# Patient Record
Sex: Female | Born: 1960 | Race: Black or African American | Hispanic: No | Marital: Single | Smoking: Former smoker
Health system: Southern US, Community
[De-identification: ages and names within clinical notes are randomized; demographics above are authoritative.]

## PROBLEM LIST (undated history)

## (undated) DIAGNOSIS — T7840XA Allergy, unspecified, initial encounter: Secondary | ICD-10-CM

## (undated) DIAGNOSIS — F32A Depression, unspecified: Secondary | ICD-10-CM

## (undated) DIAGNOSIS — E785 Hyperlipidemia, unspecified: Secondary | ICD-10-CM

## (undated) DIAGNOSIS — B20 Human immunodeficiency virus [HIV] disease: Secondary | ICD-10-CM

## (undated) DIAGNOSIS — J45909 Unspecified asthma, uncomplicated: Secondary | ICD-10-CM

## (undated) DIAGNOSIS — F411 Generalized anxiety disorder: Secondary | ICD-10-CM

## (undated) DIAGNOSIS — G4700 Insomnia, unspecified: Secondary | ICD-10-CM

## (undated) DIAGNOSIS — F329 Major depressive disorder, single episode, unspecified: Secondary | ICD-10-CM

## (undated) DIAGNOSIS — M810 Age-related osteoporosis without current pathological fracture: Secondary | ICD-10-CM

## (undated) DIAGNOSIS — M797 Fibromyalgia: Secondary | ICD-10-CM

## (undated) DIAGNOSIS — S7291XA Unspecified fracture of right femur, initial encounter for closed fracture: Secondary | ICD-10-CM

## (undated) DIAGNOSIS — M4800 Spinal stenosis, site unspecified: Secondary | ICD-10-CM

## (undated) DIAGNOSIS — M1990 Unspecified osteoarthritis, unspecified site: Secondary | ICD-10-CM

## (undated) DIAGNOSIS — K219 Gastro-esophageal reflux disease without esophagitis: Secondary | ICD-10-CM

## (undated) DIAGNOSIS — G8929 Other chronic pain: Secondary | ICD-10-CM

## (undated) DIAGNOSIS — Z5189 Encounter for other specified aftercare: Secondary | ICD-10-CM

## (undated) DIAGNOSIS — D649 Anemia, unspecified: Secondary | ICD-10-CM

## (undated) DIAGNOSIS — I10 Essential (primary) hypertension: Secondary | ICD-10-CM

## (undated) HISTORY — DX: Unspecified asthma, uncomplicated: J45.909

## (undated) HISTORY — DX: Insomnia, unspecified: G47.00

## (undated) HISTORY — PX: HERNIA REPAIR: SHX51

## (undated) HISTORY — DX: Hyperlipidemia, unspecified: E78.5

## (undated) HISTORY — DX: Unspecified fracture of right femur, initial encounter for closed fracture: S72.91XA

## (undated) HISTORY — DX: Generalized anxiety disorder: F41.1

## (undated) HISTORY — DX: Human immunodeficiency virus (HIV) disease: B20

## (undated) HISTORY — DX: Depression, unspecified: F32.A

## (undated) HISTORY — DX: Essential (primary) hypertension: I10

## (undated) HISTORY — DX: Morbid (severe) obesity due to excess calories: E66.01

## (undated) HISTORY — DX: Allergy, unspecified, initial encounter: T78.40XA

## (undated) HISTORY — DX: Other chronic pain: G89.29

## (undated) HISTORY — DX: Age-related osteoporosis without current pathological fracture: M81.0

## (undated) HISTORY — DX: Encounter for other specified aftercare: Z51.89

## (undated) HISTORY — PX: BUNIONECTOMY: SHX129

## (undated) HISTORY — PX: CHOLECYSTECTOMY: SHX55

## (undated) HISTORY — DX: Anemia, unspecified: D64.9

## (undated) HISTORY — DX: Spinal stenosis, site unspecified: M48.00

## (undated) HISTORY — PX: UPPER GASTROINTESTINAL ENDOSCOPY: SHX188

## (undated) HISTORY — DX: Fibromyalgia: M79.7

## (undated) HISTORY — PX: OTHER SURGICAL HISTORY: SHX169

## (undated) HISTORY — DX: Gastro-esophageal reflux disease without esophagitis: K21.9

## (undated) HISTORY — PX: COLONOSCOPY: SHX174

## (undated) HISTORY — DX: Major depressive disorder, single episode, unspecified: F32.9

## (undated) HISTORY — DX: Unspecified osteoarthritis, unspecified site: M19.90

---

## 1996-12-09 DIAGNOSIS — B20 Human immunodeficiency virus [HIV] disease: Secondary | ICD-10-CM

## 1996-12-09 DIAGNOSIS — Z21 Asymptomatic human immunodeficiency virus [HIV] infection status: Secondary | ICD-10-CM

## 1996-12-09 HISTORY — DX: Human immunodeficiency virus (HIV) disease: B20

## 1996-12-09 HISTORY — DX: Asymptomatic human immunodeficiency virus (hiv) infection status: Z21

## 2009-08-21 ENCOUNTER — Emergency Department (HOSPITAL_COMMUNITY): Admission: EM | Admit: 2009-08-21 | Discharge: 2009-08-21 | Payer: Self-pay | Admitting: Emergency Medicine

## 2011-03-15 LAB — COMPREHENSIVE METABOLIC PANEL
ALT: 15 U/L (ref 0–35)
AST: 22 U/L (ref 0–37)
Albumin: 3.5 g/dL (ref 3.5–5.2)
CO2: 28 mEq/L (ref 19–32)
Calcium: 8.7 mg/dL (ref 8.4–10.5)
Creatinine, Ser: 0.65 mg/dL (ref 0.4–1.2)
GFR calc non Af Amer: >60 mL/min (ref >60)
Potassium: 4 mEq/L (ref 3.5–5.1)

## 2011-03-15 LAB — DIFFERENTIAL
Eosinophils Absolute: 0 10*3/uL (ref 0.0–0.7)
Eosinophils Relative: 0 % (ref 0–5)
Lymphocytes Relative: 9 % — ABNORMAL LOW (ref 12–46)
Lymphs Abs: 0.9 10*3/uL (ref 0.7–4.0)
Monocytes Absolute: 0.3 10*3/uL (ref 0.1–1.0)
Monocytes Relative: 3 % (ref 3–12)

## 2011-03-15 LAB — URINALYSIS, ROUTINE W REFLEX MICROSCOPIC
Bilirubin Urine: NEGATIVE
Glucose, UA: NEGATIVE mg/dL
Hgb urine dipstick: NEGATIVE
Ketones, ur: NEGATIVE mg/dL
pH: 6 (ref 5.0–8.0)

## 2011-03-15 LAB — CBC
HCT: 36.5 % (ref 36.0–46.0)
Hemoglobin: 12.6 g/dL (ref 12.0–15.0)
RDW: 16.3 % — ABNORMAL HIGH (ref 11.5–15.5)

## 2011-10-03 DIAGNOSIS — B20 Human immunodeficiency virus [HIV] disease: Secondary | ICD-10-CM | POA: Insufficient documentation

## 2012-07-21 DIAGNOSIS — Z6841 Body Mass Index (BMI) 40.0 and over, adult: Secondary | ICD-10-CM | POA: Insufficient documentation

## 2014-05-19 DIAGNOSIS — I10 Essential (primary) hypertension: Secondary | ICD-10-CM | POA: Insufficient documentation

## 2017-02-06 HISTORY — PX: ORIF FEMUR FRACTURE: SHX2119

## 2017-07-28 ENCOUNTER — Encounter: Payer: Self-pay | Admitting: Infectious Diseases

## 2017-07-28 ENCOUNTER — Ambulatory Visit (INDEPENDENT_AMBULATORY_CARE_PROVIDER_SITE_OTHER): Payer: Medicare (Managed Care) | Admitting: Infectious Diseases

## 2017-07-28 VITALS — BP 159/91 | HR 82 | Temp 98.6°F

## 2017-07-28 DIAGNOSIS — Z0000 Encounter for general adult medical examination without abnormal findings: Secondary | ICD-10-CM | POA: Diagnosis not present

## 2017-07-28 DIAGNOSIS — R252 Cramp and spasm: Secondary | ICD-10-CM | POA: Diagnosis not present

## 2017-07-28 DIAGNOSIS — Z1231 Encounter for screening mammogram for malignant neoplasm of breast: Secondary | ICD-10-CM | POA: Diagnosis not present

## 2017-07-28 DIAGNOSIS — Z9189 Other specified personal risk factors, not elsewhere classified: Secondary | ICD-10-CM | POA: Diagnosis not present

## 2017-07-28 DIAGNOSIS — B20 Human immunodeficiency virus [HIV] disease: Secondary | ICD-10-CM | POA: Diagnosis not present

## 2017-07-28 DIAGNOSIS — I10 Essential (primary) hypertension: Secondary | ICD-10-CM

## 2017-07-28 DIAGNOSIS — Z5181 Encounter for therapeutic drug level monitoring: Secondary | ICD-10-CM | POA: Diagnosis not present

## 2017-07-28 DIAGNOSIS — Z1239 Encounter for other screening for malignant neoplasm of breast: Secondary | ICD-10-CM

## 2017-07-28 LAB — CBC WITH DIFFERENTIAL/PLATELET
BASOS PCT: 0 %
Basophils Absolute: 0 cells/uL (ref 0–200)
EOS ABS: 144 {cells}/uL (ref 15–500)
Eosinophils Relative: 2 %
HEMATOCRIT: 36.3 % (ref 35.0–45.0)
Hemoglobin: 11.9 g/dL (ref 11.7–15.5)
Lymphocytes Relative: 21 %
Lymphs Abs: 1512 cells/uL (ref 850–3900)
MCH: 31.3 pg (ref 27.0–33.0)
MCHC: 32.8 g/dL (ref 32.0–36.0)
MCV: 95.5 fL (ref 80.0–100.0)
MONO ABS: 288 {cells}/uL (ref 200–950)
MPV: 9.7 fL (ref 7.5–12.5)
Monocytes Relative: 4 %
NEUTROS ABS: 5256 {cells}/uL (ref 1500–7800)
Neutrophils Relative %: 73 %
PLATELETS: 270 10*3/uL (ref 140–400)
RBC: 3.8 MIL/uL (ref 3.80–5.10)
RDW: 14.1 % (ref 11.0–15.0)
WBC: 7.2 10*3/uL (ref 3.8–10.8)

## 2017-07-28 NOTE — Assessment & Plan Note (Signed)
Will check mag and potassium today. I believe this is due to her HCTZ.

## 2017-07-28 NOTE — Assessment & Plan Note (Addendum)
Will continue Odefsey and Tivicay today. 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 She was virologically suppressed at the last labs from March 2018. I have looked at her GenoSure. No signs of OI today on exam. I believe her CD4 was acutely low in the setting of trauma / stress. Will reassess VL and CD4 today with reflex to geno. I have also asked her to supply a urine sample for U/A. She will return to see me in about 3 months.

## 2017-07-28 NOTE — Patient Instructions (Addendum)
We will check your blood today including kidney, liver and electrolyte lab work.   Call your provider that started you on the Hydrochlorothiazide - it is likely what is causing your cramps in your legs and dizziness. I worry you will not be able to tolerate a higher dose.   We will continue your Tivicay and Charlett Lango today.   We can do your pap smear at your next appointment. I have sent in orders for your mammogram and DEXA (Bone) scan.   Call for an appointment at the [facility] [place]:  Address: [address], [place], [place] [address]  Phone: [phone]  We will see you back in 3 months   Please sign up with MyChart to access your labs and set up email communication with our clinic for non-urgent medical concerns.

## 2017-07-28 NOTE — Progress Notes (Signed)
PCP - Dr. Lilyan Gilford [facility]   Patient Active Problem List   Diagnosis Date Noted  . Leg cramps 07/28/2017  . Healthcare maintenance 07/28/2017  . Essential hypertension 05/19/2014  . Morbid obesity with BMI of 40.0-44.9, adult ([place]) 07/21/2012  . Human immunodeficiency virus (HIV) disease ([place]) 10/03/2011    Patient's Medications  New Prescriptions   No medications on file  Previous Medications   ALBUTEROL (PROVENTIL HFA;VENTOLIN HFA) 108 (90 BASE) MCG/ACT INHALER    Inhale 2 puffs into the lungs every 4 (four) hours as needed for wheezing or shortness of breath.   ASPIRIN EC 81 MG TABLET    Take 81 mg by mouth daily.   DIPHENHYDRAMINE (BENADRYL) 25 MG TABLET    Take 25 mg by mouth every 8 (eight) hours as needed.   DOLUTEGRAVIR (TIVICAY) 50 MG TABLET    Take 50 mg by mouth daily.   DULOXETINE (CYMBALTA) 30 MG CAPSULE    Take 30 mg by mouth daily.   EMTRICITABINE-RILPIVIR-TENOFOVIR AF (ODEFSEY) 200-25-25 MG TABS TABLET    Take 1 tablet by mouth daily with breakfast.   EPINEPHRINE (EPIPEN 2-PAK) 0.3 MG/0.3 ML IJ SOAJ INJECTION    Inject into the muscle once.   HYDROCHLOROTHIAZIDE (MICROZIDE) 12.5 MG CAPSULE    Take 12.5 mg by mouth daily.   IBUPROFEN (ADVIL,MOTRIN) 200 MG TABLET    Take 200 mg by mouth every 6 (six) hours as needed.   LORATADINE (CLARITIN) 10 MG TABLET    Take 10 mg by mouth daily.   ONDANSETRON (ZOFRAN-ODT) 4 MG DISINTEGRATING TABLET    Take 4 mg by mouth every 8 (eight) hours as needed for nausea or vomiting.   VITAMIN C (ASCORBIC ACID) 500 MG TABLET    Take 500 mg by mouth daily.  Modified Medications   No medications on file  Discontinued Medications   No medications on file    Subjective: Katherine Howell presents to clinic today for routine follow up care for her HIV infection. She was referred by [facility]. Has been [place] since 1998 (CD4 nadir 10) due to multiple blood transfusions. Other medical problems include hypertension,  fibromyalgia, insomnia, osteoporosis, anemia, lung disease (inhaler use frequently), arthritis in knees, obesity, fall in 02/12/2017 requiring orthopedic repair of right knee - was discharged to [facility] in [place]. Her HIV was managed through Dr. Lilyan Gilford @ [facility].   HPI:  HIV =  Currently on a regimen of [place] and is tolerating it well. Previously on Combivir and Efavirenz. Over the last 30 days she has missed no doses and tolerating this regimen well aside from some occasional sleep problems. No concerns regarding medications or HIV disease. Denies associated symptoms including fevers, chills, headaches, weight loss, SOB, abdominal pain / cramping, diarrhea, lymphadenopathy.  Last viral load undetectable. CD4 counts usually > 500 however during acute hospitalization after fracture and surgery this dropped to 190 in March 2018. No further labs to follow up since. Not currently sexually active.    Hypertension = was started on HCTZ 12.5 mg a week ago. Now endorses leg cramps and dizziness. Denies any headaches. Has taken a few extra potassium pills lately to help remedy cramps in addition to mustard. 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   Health Maintenance = Uses mobility aids and a wheelchair at times d/t knee pain, immobility and DOE. She cares for  her mother (59 yo with Alzheimers) and up until recently was caring for her autistic son however he is now in a group home. High amount of stress. Last pap and mammogram were ~ 3y ago and reportedly normal. Colonoscopy normal around 56 yo with 56yrfollow up recommended and DEXA about 5 years ago.   Review of Systems: Review of Systems  Constitutional: Negative for chills, fever, malaise/fatigue and weight loss.  HENT: Negative for sore throat.        No dental problems  Respiratory: Negative for cough and sputum production.   Cardiovascular: Positive  for leg swelling. Negative for chest pain.  Gastrointestinal: Negative for abdominal pain, diarrhea and vomiting.  Genitourinary: Negative for dysuria.  Musculoskeletal: Positive for joint pain (knees and ankles). Negative for myalgias and neck pain.  Skin: Negative for rash.  Neurological: Negative for dizziness and headaches.  Psychiatric/Behavioral: Negative for depression and substance abuse. The patient has insomnia. The patient is not nervous/anxious.     Past Medical History:  Diagnosis Date  . Anemia   . Arthritis   . Chronic pain   . Fibromyalgia   . GAD (generalized anxiety disorder)   . HIV (human immunodeficiency virus infection) ([place] 1998  . Hypertension   . Insomnia   . Obesity, morbid, BMI 40.0-49.9 ([place]   . Spinal stenosis     Social History  Substance Use Topics  . Smoking status: Former Smoker    Types: Cigarettes  . Smokeless tobacco: Former USystems developer . Alcohol use No     Comment: occ    Family History  Problem Relation Age of Onset  . Alzheimer's disease Mother     Allergies  Allergen Reactions  . Iodine Anaphylaxis  . Other Anaphylaxis  . Shellfish-Derived Products Rash    Other reaction(s): Respiratory Distress (ALLERGY/intolerance), Shock (ALLERGY) SHRIMP-Rash] [Shock] [Resp]  . Diflunisal Other (See Comments)    Other reaction(s): Facial Edema (intolerance)  . Penicillins Other (See Comments)    Other] headache  . Sulfamethoxazole Rash  . Voltaren  [Diclofenac Sodium] Swelling    Unilateral leg swelling Unilateral leg swelling  . Diclofenac     Objective:  Vitals:   07/28/17 1343  BP: (!) 159/91  Pulse: 82  Temp: 98.6 F (37 C)  TempSrc: Oral   There is no height or weight on file to calculate BMI.  Physical Exam  Constitutional: She is oriented to person, place, and time and well-developed, well-nourished, and in no distress.  Very pleasant obese woman seated in wheelchair.   HENT:  Mouth/Throat: No oral lesions. No dental  abscesses. No oropharyngeal exudate.  Eyes: No scleral icterus.  Neck: Neck supple.  Cardiovascular: Normal rate, regular rhythm, normal heart sounds and intact distal pulses.   No murmur heard. Pulmonary/Chest: Effort normal and breath sounds normal.  Abdominal: Soft. She exhibits no distension. There is no tenderness.  Musculoskeletal: Normal range of motion. She exhibits no tenderness.  Lymphadenopathy:    She has no cervical adenopathy.  Neurological: She is alert and oriented to person, place, and time.  Skin: Skin is warm and dry. No rash noted.  Psychiatric: Mood, affect and judgment normal.    Lab Results  GenoSure  NRTIs  ZDV (zidovudine or Retrovir)  ! NO! ABC (abacavir or Ziagen)  ! NO! ddI (didanosine or Videx)  ! NO! 3TC (lamivudine or Epivir) !YES!M184V FTC (emtricitabine or Emtriva) !YES!M184V d4T (stavudine or Zerit)  ! NO! TDF (tenofovir or Viread)   ! NO!  NNRTIs ETR (etravirine or Intelence)  ! NO! EFV (efavirenz or Sustiva) !DJT!T017B NVP (nevirapine or Viramune) !LTJ!Q300P RPV (rilpivirine or Edurant)  ! NO!  PIs  FPV (fos-amprenavir or Lexiva) ! NO! IDV (indinavir or Crixivan)   ! NO! NFV (nelfinavir or Viracept)  ! NO! SQV (saquinavir or Invirase) ! NO! LPV (lopinavir or Kaletra)  ! NO! ATV (atazanavir or Reyataz) ! NO! TPV (tipranavir or Aptivus)   ! NO! DRV (darunavir or Prezista)  ! NO!  PRB = PROBABLE OR EMERGING RESISTANCE OTHER MUTATIONS DETECTED: RT GENE MUTATIONS: V90I,H221Y PR GENE MUTATIONS: I13V,G16E,L63A,V77I       I reviewed all records sent to our clinic and in [place] related to her HIV.    Problem List Items Addressed This Visit      Cardiovascular and Mediastinum   Essential hypertension    Started on HCTZ 12.5 mg 1 week ago by PCP. Reports worsening  leg cramps and dizziness. I worry she will not be able to tolerate an increased in this dose. I suggested she call her PCP to discuss side effects and consideration for alternative medication. She is above target BP today. Will screen diabetes and lipids today.       Relevant Medications   hydrochlorothiazide (MICROZIDE) 12.5 MG capsule     Other   Human immunodeficiency virus (HIV) disease (HCC) (Chronic)    Will continue Odefsey and Tivicay today. 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 She was virologically suppressed at the last labs from March 2018. I have looked at her [place]. No signs of OI today on exam. I believe her CD4 was acutely low in the setting of trauma / stress. Will reassess VL and CD4 today with reflex to geno. I have also asked her to supply a urine sample for U/A. She will return to see me in about 3 months.       Relevant Orders   Hepatitis B surface antigen   Hepatitis B surface antibody   Hepatitis A antibody, total   RPR   HLA B*5701   HIV-1 RNA ultraquant reflex to gentyp+   T-helper cell (CD4)- ([facility] clinic only)   COMPLETE METABOLIC PANEL WITH GFR   CBC with Differential/Platelet   Quantiferon tb gold assay (blood)   Magnesium   Leg cramps    Will check mag and potassium today. I believe this is due to her HCTZ.       Healthcare maintenance    Orders for screening DEXA and Mammogram placed today. Will do PAP at next appointment. Hep B sAb was neg in 2015 but she reports she either started or completed the series at [facility]. Will assess immunity today and determine need for double strength booster. Influenza vaccine at next visit as well.        Other Visit Diagnoses    Breast screening    -  Primary   Breast cancer screening       Relevant Orders   MM Digital Screening   At risk for decreased bone density       Relevant Orders   DG DXA FRACTURE ASSESSMENT   Medication monitoring encounter       Relevant Orders   Lipid panel   Hemoglobin A1c   Urinalysis        Janene Madeira, MSN, NP-C [facility]   07/28/17 2:47 PM

## 2017-07-28 NOTE — Assessment & Plan Note (Signed)
Started on HCTZ 12.5 mg 1 week ago by PCP. Reports worsening leg cramps and dizziness. I worry she will not be able to tolerate an increased in this dose. I suggested she call her PCP to discuss side effects and consideration for alternative medication. She is above target BP today. Will screen diabetes and lipids today.

## 2017-07-28 NOTE — Assessment & Plan Note (Signed)
Orders for screening DEXA and Mammogram placed today. Will do PAP at next appointment. Hep B sAb was neg in 2015 but she reports she either started or completed the series at [facility]. Will assess immunity today and determine need for double strength booster. Influenza vaccine at next visit as well.

## 2017-07-29 LAB — URINALYSIS, ROUTINE W REFLEX MICROSCOPIC
BILIRUBIN URINE: NEGATIVE
Glucose, UA: NEGATIVE mg/dL
Hgb urine dipstick: NEGATIVE
Ketones, ur: NEGATIVE mg/dL
Leukocytes, UA: NEGATIVE
NITRITE: NEGATIVE
PROTEIN: NEGATIVE mg/dL
SPECIFIC GRAVITY, URINE: 1.011 (ref 1.005–1.030)
pH: 5 (ref 5.0–8.0)

## 2017-07-29 LAB — T-HELPER CELL (CD4) - (RCID CLINIC ONLY)
CD4 T CELL HELPER: 28 % — AB (ref 33–55)
CD4 T Cell Abs: 470 /uL (ref 400–2700)

## 2017-07-29 LAB — HEPATITIS B SURFACE ANTIBODY,QUALITATIVE: HEP B S AB: NONREACTIVE

## 2017-07-29 LAB — HEPATITIS B SURFACE ANTIGEN: HEP B S AG: NONREACTIVE

## 2017-07-29 LAB — HEMOGLOBIN A1C
HEMOGLOBIN A1C: 4.8 % (ref <5.7)
Mean Plasma Glucose: 91 mg/dL

## 2017-07-29 LAB — COMPLETE METABOLIC PANEL WITH GFR
ALT: 10 U/L (ref 6–29)
AST: 23 U/L (ref 10–35)
Albumin: 4 g/dL (ref 3.6–5.1)
Alkaline Phosphatase: 88 U/L (ref 33–130)
BUN: 12 mg/dL (ref 7–25)
CHLORIDE: 100 mmol/L (ref 98–110)
CO2: 24 mmol/L (ref 20–32)
Calcium: 9.4 mg/dL (ref 8.6–10.4)
Creat: 0.67 mg/dL (ref 0.50–1.05)
Glucose, Bld: 93 mg/dL (ref 65–99)
POTASSIUM: 3.4 mmol/L — AB (ref 3.5–5.3)
Sodium: 137 mmol/L (ref 135–146)
Total Bilirubin: 0.4 mg/dL (ref 0.2–1.2)
Total Protein: 8.9 g/dL — ABNORMAL HIGH (ref 6.1–8.1)

## 2017-07-29 LAB — LIPID PANEL
CHOL/HDL RATIO: 3.3 ratio (ref <5.0)
Cholesterol: 221 mg/dL — ABNORMAL HIGH (ref <200)
HDL: 67 mg/dL (ref >50)
LDL Cholesterol: 137 mg/dL — ABNORMAL HIGH (ref <100)
TRIGLYCERIDES: 85 mg/dL (ref <150)
VLDL: 17 mg/dL (ref <30)

## 2017-07-29 LAB — RPR

## 2017-07-29 LAB — MAGNESIUM: MAGNESIUM: 1.8 mg/dL (ref 1.5–2.5)

## 2017-07-29 LAB — HEPATITIS A ANTIBODY, TOTAL: HEP A TOTAL AB: REACTIVE — AB

## 2017-07-30 LAB — QUANTIFERON TB GOLD ASSAY (BLOOD)
Interferon Gamma Release Assay: NEGATIVE
Quantiferon Nil Value: 0.06 IU/mL
Quantiferon Tb Ag Minus Nil Value: 0.03 IU/mL

## 2017-07-31 LAB — HIV-1 RNA,QN PCR W/REFLEX GENOTYPE
HIV-1 RNA, QN PCR: 1.9 {Log_copies}/mL — AB
HIV-1 RNA, QN PCR: 80 {copies}/mL — AB

## 2017-08-01 ENCOUNTER — Other Ambulatory Visit: Payer: Self-pay | Admitting: Family Medicine

## 2017-08-01 DIAGNOSIS — Z1231 Encounter for screening mammogram for malignant neoplasm of breast: Secondary | ICD-10-CM

## 2017-08-01 DIAGNOSIS — M8580 Other specified disorders of bone density and structure, unspecified site: Secondary | ICD-10-CM

## 2017-08-04 LAB — HLA B*5701: HLA-B 5701 W/RFLX HLA-B HIGH: NEGATIVE

## 2017-08-05 ENCOUNTER — Ambulatory Visit: Payer: Medicare (Managed Care)

## 2017-08-19 ENCOUNTER — Other Ambulatory Visit: Payer: Medicare (Managed Care)

## 2017-08-19 ENCOUNTER — Ambulatory Visit: Payer: Medicare (Managed Care)

## 2017-08-26 ENCOUNTER — Encounter: Payer: Self-pay | Admitting: Gastroenterology

## 2017-09-11 ENCOUNTER — Ambulatory Visit (INDEPENDENT_AMBULATORY_CARE_PROVIDER_SITE_OTHER): Payer: Medicare (Managed Care) | Admitting: Obstetrics & Gynecology

## 2017-09-11 ENCOUNTER — Encounter: Payer: Self-pay | Admitting: Obstetrics & Gynecology

## 2017-09-11 VITALS — BP 153/87 | HR 84 | Wt 266.0 lb

## 2017-09-11 DIAGNOSIS — Z01419 Encounter for gynecological examination (general) (routine) without abnormal findings: Secondary | ICD-10-CM

## 2017-09-11 DIAGNOSIS — N9089 Other specified noninflammatory disorders of vulva and perineum: Secondary | ICD-10-CM

## 2017-09-11 NOTE — Progress Notes (Signed)
Patient ID: Katherine Howell, female   DOB: 10-17-1961, 56 y.o.   MRN: [id number]  Chief Complaint  Patient presents with  . Gynecologic Exam    HPI Katherine Howell is a 56 y.o. female.   HPI  She is here today for a pap smear.  She is abstinent for about 4 years. She went through menopause at 56 yo.  Past Medical History:  Diagnosis Date  . Anemia   . Arthritis   . Chronic pain   . Fibromyalgia   . GAD (generalized anxiety disorder)   . HIV (human immunodeficiency virus infection) (HCC) 1998  . Hypertension   . Insomnia   . Obesity, morbid, BMI 40.0-49.9 (HCC)   . Spinal stenosis     Past Surgical History:  Procedure Laterality Date  . CHOLECYSTECTOMY    . HERNIA REPAIR    . ORIF FEMUR FRACTURE Right 02/2017  . total knee Left     Family History  Problem Relation Age of Onset  . Alzheimer's disease Mother     Social History Social History  Substance Use Topics  . Smoking status: Former Smoker    Types: Cigarettes  . Smokeless tobacco: Former Neurosurgeon  . Alcohol use No     Comment: occ    Allergies  Allergen Reactions  . Iodine Anaphylaxis  . Other Anaphylaxis  . Shellfish-Derived Products Rash    Other reaction(s): Respiratory Distress (ALLERGY/intolerance), Shock (ALLERGY) SHRIMP-Rash] [Shock] [Resp]  . Diflunisal Other (See Comments)    Other reaction(s): Facial Edema (intolerance)  . Penicillins Other (See Comments)    Other] headache  . Sulfamethoxazole Rash  . Voltaren  [Diclofenac Sodium] Swelling    Unilateral leg swelling Unilateral leg swelling  . Diclofenac     Current Outpatient Prescriptions  Medication Sig Dispense Refill  . albuterol (PROVENTIL HFA;VENTOLIN HFA) 108 (90 Base) MCG/ACT inhaler Inhale 2 puffs into the lungs every 4 (four) hours as needed for wheezing or shortness of breath.    Marland Kitchen aspirin EC 81 MG tablet Take 81 mg by mouth daily.    . diphenhydrAMINE (BENADRYL) 25 MG tablet Take 25 mg by mouth every 8 (eight) hours as  needed.    . dolutegravir (TIVICAY) 50 MG tablet Take 50 mg by mouth daily.    . DULoxetine (CYMBALTA) 30 MG capsule Take 30 mg by mouth daily.    Marland Kitchen emtricitabine-rilpivir-tenofovir AF (ODEFSEY) 200-25-25 MG TABS tablet Take 1 tablet by mouth daily with breakfast.    . EPINEPHrine (EPIPEN 2-PAK) 0.3 mg/0.3 mL IJ SOAJ injection Inject into the muscle once.    . hydrochlorothiazide (MICROZIDE) 12.5 MG capsule Take 12.5 mg by mouth daily.    Marland Kitchen ibuprofen (ADVIL,MOTRIN) 200 MG tablet Take 200 mg by mouth every 6 (six) hours as needed.    Marland Kitchen lisinopril (PRINIVIL,ZESTRIL) 10 MG tablet Take 10 mg by mouth daily.    Marland Kitchen loratadine (CLARITIN) 10 MG tablet Take 10 mg by mouth daily.    Marland Kitchen OLANZapine (ZYPREXA) 2.5 MG tablet Take 2.5 mg by mouth at bedtime.    . ondansetron (ZOFRAN-ODT) 4 MG disintegrating tablet Take 4 mg by mouth every 8 (eight) hours as needed for nausea or vomiting.    . pregabalin (LYRICA) 75 MG capsule Take 75 mg by mouth 2 (two) times daily.    . traZODone (DESYREL) 50 MG tablet Take 50 mg by mouth at bedtime.    . vitamin C (ASCORBIC ACID) 500 MG tablet Take 500 mg by mouth daily.  No current facility-administered medications for this visit.     Review of Systems Review of Systems  She got a flu vaccine today.  Blood pressure (!) 153/87, pulse 84, weight 266 lb (120.7 kg).  Physical Exam Physical Exam  Wheel-chair bound black female- pap smear done  Mammogram scheduled for 10-01-17 EG-she has a lesion in her right groin (?HSV) Cervix appears normal No masses with bimanual exam   Assessment    Cervical cancer screening Vulvar lesion in right groin (? From her pullups)    Plan    Pap and mammogram   Check HSV2 IgG    Christianne Zacher C Ambre Kobayashi 09/11/2017, 2:58 PM

## 2017-09-12 LAB — CYTOLOGY - PAP
DIAGNOSIS: NEGATIVE
HPV: NOT DETECTED

## 2017-09-12 LAB — HSV 2 ANTIBODY, IGG: HSV 2 IgG, Type Spec: 12.2 index — ABNORMAL HIGH (ref 0.00–0.90)

## 2017-09-16 ENCOUNTER — Telehealth: Payer: Self-pay

## 2017-09-16 MED ORDER — VALACYCLOVIR HCL 1 G PO TABS: 1000.0000 mg | ORAL_TABLET | Freq: Two times a day (BID) | ORAL | 6 refills | Status: DC

## 2017-09-16 NOTE — Telephone Encounter (Signed)
Patient has been notified of results and a prescription for Valtrex 1 g PO BID has been sent to [facility] per Dr. Hollie Salk dove, patient verbalized understanding

## 2017-09-16 NOTE — Telephone Encounter (Signed)
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 Please offer her Valtrex 1 gram po BID for a week with 6 refills. Thanks

## 2017-09-30 ENCOUNTER — Ambulatory Visit
Admission: RE | Admit: 2017-09-30 | Discharge: 2017-09-30 | Disposition: A | Discharge status: Home / Self Care | Payer: Medicare (Managed Care) | Source: Ambulatory Visit | Attending: Family Medicine | Admitting: Family Medicine

## 2017-09-30 DIAGNOSIS — M8580 Other specified disorders of bone density and structure, unspecified site: Secondary | ICD-10-CM

## 2017-09-30 DIAGNOSIS — Z1231 Encounter for screening mammogram for malignant neoplasm of breast: Secondary | ICD-10-CM

## 2017-10-08 ENCOUNTER — Telehealth: Payer: Self-pay | Admitting: *Deleted

## 2017-10-08 NOTE — Telephone Encounter (Signed)
Called pt- asked about wheel chair bound per PCP- Pt states she has a wheel chair but she can stand and take steps with her walker, she can dress and undress self with no issues .  Instructed pt of date and time of PV and colon   Katherine Howell PV

## 2017-10-14 ENCOUNTER — Telehealth: Payer: Self-pay | Admitting: *Deleted

## 2017-10-14 NOTE — Telephone Encounter (Signed)
Dr Myrtie Neither,   This pt is scheduled to see you on 11*-27 Tuesday for a direct colon, No gi hx .  She has a hx of spinal stenosis, HIV, COPD, Anemia. LTKR, Femur fx 02-14-2017, Fibromyalgia- and her PCP  noted she is wheel chair bound. I called her and she states she can takes several steps with a walker and she can transfer her self and dress and undress. I just  wanted to be sure she was okay for a direct colon and did not need an OV with you.    Please advise and thanks for your time,  Elizebeth Brooking

## 2017-10-14 NOTE — Telephone Encounter (Signed)
Thanks for checking. If she can transfer and dress, the she can have her procedure done in the [facility].  - HD

## 2017-10-21 ENCOUNTER — Other Ambulatory Visit: Payer: Self-pay

## 2017-10-21 ENCOUNTER — Ambulatory Visit (AMBULATORY_SURGERY_CENTER): Payer: Self-pay | Admitting: *Deleted

## 2017-10-21 VITALS — Ht 66.0 in | Wt 279.6 lb

## 2017-10-21 DIAGNOSIS — Z1211 Encounter for screening for malignant neoplasm of colon: Secondary | ICD-10-CM

## 2017-10-21 MED ORDER — NA SULFATE-K SULFATE-MG SULF 17.5-3.13-1.6 GM/177ML PO SOLN: 1.0000 [IU] | Freq: Once | ORAL | 0 refills | Status: AC

## 2017-10-21 NOTE — Progress Notes (Signed)
No egg or soy allergy known to patient  Some issues with past sedation with any surgeries  or procedures, no intubation problems  Sometimes nausea and hard to wake up. No diet pills per patient  No home 02 use per patient  No blood thinners per patient  Pt denies issues with constipation  No A fib or A flutter  EMMI video sent to pt's e mail  No email

## 2017-11-03 ENCOUNTER — Encounter: Payer: Self-pay | Admitting: Gastroenterology

## 2017-11-04 ENCOUNTER — Ambulatory Visit (AMBULATORY_SURGERY_CENTER): Payer: Medicare (Managed Care) | Admitting: Gastroenterology

## 2017-11-04 ENCOUNTER — Encounter: Payer: Self-pay | Admitting: Gastroenterology

## 2017-11-04 VITALS — BP 157/77 | HR 85 | Temp 98.9°F | Resp 14 | Ht 66.0 in | Wt 279.6 lb

## 2017-11-04 DIAGNOSIS — Z1211 Encounter for screening for malignant neoplasm of colon: Secondary | ICD-10-CM

## 2017-11-04 DIAGNOSIS — Z1212 Encounter for screening for malignant neoplasm of rectum: Secondary | ICD-10-CM

## 2017-11-04 MED ORDER — SODIUM CHLORIDE 0.9 % IV SOLN: 500.0000 mL | INTRAVENOUS | Status: DC

## 2017-11-04 NOTE — Op Note (Signed)
[facility] Patient Name: Katherine Howell Procedure Date: 11/04/2017 9:03 AM MRN: [id number] Endoscopist: Sherilyn Cooter L. Myrtie Neither , MD Age: 56 Referring MD:  Date of Birth: [birth year] Gender: Female Account #: [id number] Procedure:                Colonoscopy Indications:              Screening for colorectal malignant neoplasm, This                            is the patient's first colonoscopy Medicines:                Monitored Anesthesia Care Procedure:                Pre-Anesthesia Assessment:                           - Prior to the procedure, a History and Physical                            was performed, and patient medications and                            allergies were reviewed. The patient's tolerance of                            previous anesthesia was also reviewed. The risks                            and benefits of the procedure and the sedation                            options and risks were discussed with the patient.                            All questions were answered, and informed consent                            was obtained. Anticoagulants: The patient has taken                            aspirin. It was decided not to withhold this                            medication prior to the procedure. ASA Grade                            Assessment: III - A patient with severe systemic                            disease. After reviewing the risks and benefits,                            the patient was deemed in satisfactory condition to  undergo the procedure.                           After obtaining informed consent, the colonoscope                            was passed under direct vision. Throughout the                            procedure, the patient's blood pressure, pulse, and                            oxygen saturations were monitored continuously. The                            Model PCF-[id number]) scope was  introduced                            through the anus and advanced to the the cecum,                            identified by appendiceal orifice and ileocecal                            valve. The colonoscopy was performed without                            difficulty. The patient tolerated the procedure                            well. The quality of the bowel preparation was                            excellent. The ileocecal valve, appendiceal                            orifice, and rectum were photographed. The quality                            of the bowel preparation was evaluated using the                            BBPS [facility] Bowel Preparation Scale) with scores                            of: Right Colon = 3, Transverse Colon = 3 and Left                            Colon = 3 (entire mucosa seen well with no residual                            staining, small fragments of stool or opaque  liquid). The total BBPS score equals 9. The bowel                            preparation used was SUPREP. Scope In: 9:16:33 AM Scope Out: 9:30:38 AM Scope Withdrawal Time: 0 hours 9 minutes 27 seconds  Total Procedure Duration: 0 hours 14 minutes 5 seconds  Findings:                 The perianal and digital rectal examinations were                            normal.                           Multiple medium-mouthed diverticula were found in                            the left colon.                           The exam was otherwise without abnormality on                            direct and retroflexion views. Complications:            No immediate complications. Estimated Blood Loss:     Estimated blood loss: none. Impression:               - Diverticulosis in the left colon.                           - The examination was otherwise normal on direct                            and retroflexion views.                           - No specimens  collected. Recommendation:           - Patient has a contact number available for                            emergencies. The signs and symptoms of potential                            delayed complications were discussed with the                            patient. Return to normal activities tomorrow.                            Written discharge instructions were provided to the                            patient.                           - Resume previous diet.                           -  Continue present medications.                           - Repeat colonoscopy in 10 years for screening                            purposes. Jakhari Space L. Myrtie Neither, MD 11/04/2017 9:33:15 AM This report has been signed electronically.

## 2017-11-04 NOTE — Patient Instructions (Signed)
YOU HAD AN ENDOSCOPIC PROCEDURE TODAY AT THE [facility]:   Refer to the procedure report that was given to you for any specific questions about what was found during the examination.  If the procedure report does not answer your questions, please call your gastroenterologist to clarify.  If you requested that your care partner not be given the details of your procedure findings, then the procedure report has been included in a sealed envelope for you to review at your convenience later.  YOU SHOULD EXPECT: Some feelings of bloating in the abdomen. Passage of more gas than usual.  Walking can help get rid of the air that was put into your GI tract during the procedure and reduce the bloating. If you had a lower endoscopy (such as a colonoscopy or flexible sigmoidoscopy) you may notice spotting of blood in your stool or on the toilet paper. If you underwent a bowel prep for your procedure, you may not have a normal bowel movement for a few days.  Please Note:  You might notice some irritation and congestion in your nose or some drainage.  This is from the oxygen used during your procedure.  There is no need for concern and it should clear up in a day or so.  SYMPTOMS TO REPORT IMMEDIATELY:   Following lower endoscopy (colonoscopy or flexible sigmoidoscopy):  Excessive amounts of blood in the stool  Significant tenderness or worsening of abdominal pains  Swelling of the abdomen that is new, acute  Fever of 100F or higher   Following upper endoscopy (EGD)  Vomiting of blood or coffee ground material  New chest pain or pain under the shoulder blades  Painful or persistently difficult swallowing  New shortness of breath  Fever of 100F or higher  Black, tarry-looking stools  For urgent or emergent issues, a gastroenterologist can be reached at any hour by calling [phone].   DIET:  We do recommend a small meal at first, but then you may proceed to your regular diet.  Drink  plenty of fluids but you should avoid alcoholic beverages for 24 hours.  ACTIVITY:  You should plan to take it easy for the rest of today and you should NOT DRIVE or use heavy machinery until tomorrow (because of the sedation medicines used during the test).    FOLLOW UP: Our staff will call the number listed on your records the next business day following your procedure to check on you and address any questions or concerns that you may have regarding the information given to you following your procedure. If we do not reach you, we will leave a message.  However, if you are feeling well and you are not experiencing any problems, there is no need to return our call.  We will assume that you have returned to your regular daily activities without incident.  If any biopsies were taken you will be contacted by phone or by letter within the next 1-3 weeks.  Please call us at [phone] if you have not heard about the biopsies in 3 weeks.    SIGNATURES/CONFIDENTIALITY: You and/or your care partner have signed paperwork which will be entered into your electronic medical record.  These signatures attest to the fact that that the information above on your After Visit Summary has been reviewed and is understood.  Full responsibility of the confidentiality of this discharge information lies with you and/or your care-partner.  Diverticulosis information given.  Recall 10 years-2028

## 2017-11-04 NOTE — Progress Notes (Signed)
Report given to PACU, vss 

## 2017-11-04 NOTE — Progress Notes (Signed)
Pt's states no medical or surgical changes since previsit or office visit. 

## 2017-11-05 ENCOUNTER — Telehealth: Payer: Self-pay

## 2017-11-05 NOTE — Telephone Encounter (Signed)

## 2018-01-30 ENCOUNTER — Telehealth: Payer: Self-pay | Admitting: Cardiology

## 2018-01-30 NOTE — Telephone Encounter (Signed)
Records received from [facility] on 01/30/18, Appt 02/05/18 @ 10:20am.NV

## 2018-02-05 ENCOUNTER — Ambulatory Visit (INDEPENDENT_AMBULATORY_CARE_PROVIDER_SITE_OTHER): Payer: Medicare (Managed Care) | Admitting: Cardiology

## 2018-02-05 ENCOUNTER — Encounter: Payer: Self-pay | Admitting: Cardiology

## 2018-02-05 VITALS — BP 140/88 | HR 77 | Wt 290.0 lb

## 2018-02-05 DIAGNOSIS — R400 Somnolence: Secondary | ICD-10-CM | POA: Diagnosis not present

## 2018-02-05 DIAGNOSIS — R0789 Other chest pain: Secondary | ICD-10-CM | POA: Diagnosis not present

## 2018-02-05 DIAGNOSIS — R002 Palpitations: Secondary | ICD-10-CM | POA: Insufficient documentation

## 2018-02-05 DIAGNOSIS — Z6841 Body Mass Index (BMI) 40.0 and over, adult: Secondary | ICD-10-CM | POA: Diagnosis not present

## 2018-02-05 DIAGNOSIS — I493 Ventricular premature depolarization: Secondary | ICD-10-CM | POA: Diagnosis not present

## 2018-02-05 DIAGNOSIS — I10 Essential (primary) hypertension: Secondary | ICD-10-CM | POA: Diagnosis not present

## 2018-02-05 NOTE — Patient Instructions (Signed)
MEDICATION INSTRUCTION   NO CHANGES   TEST  SCHEDULE AT [address] Your physician has requested that you have an echocardiogram. Echocardiography is a painless test that uses sound waves to create images of your heart. It provides your doctor with information about the size and shape of your heart and how well your heart's chambers and valves are working. This procedure takes approximately one hour. There are no restrictions for this procedure.  AND Your physician has recommended that you wear a holter monitor 48 HOURS. Holter monitors are medical devices that record the heart's electrical activity. Doctors most often use these monitors to diagnose arrhythmias. Arrhythmias are problems with the speed or rhythm of the heartbeat. The monitor is a small, portable device. You can wear one while you do your normal daily activities. This is usually used to diagnose what is causing palpitations/syncope (passing out).     SCHEDULE A OUTPATIENT HOME SLEEP STUDY Your physician has recommended that you have a sleep study. This test records several body functions during sleep, including: brain activity, eye movement, oxygen and carbon dioxide blood levels, heart rate and rhythm, breathing rate and rhythm, the flow of air through your mouth and nose, snoring, body muscle movements, and chest and belly movement.  Your physician recommends that you schedule a follow-up appointment in 2 months with DR HARDING.
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
## 2018-02-07 ENCOUNTER — Encounter: Payer: Self-pay | Admitting: Cardiology

## 2018-02-08 ENCOUNTER — Encounter: Payer: Self-pay | Admitting: Cardiology

## 2018-02-08 NOTE — Assessment & Plan Note (Signed)
With her body habitus, daytime somnolence and documented snoring, we need to exclude OSA.  We will try to set up with home sleep test.  We will also check 2D echo to exclude pulmonary pretension from OSA/OHS.

## 2018-02-08 NOTE — Assessment & Plan Note (Signed)
Clearly has PVCs on exam, but her symptoms now sound more like she is having some pauses.  Cannot exclude symptomatic bradycardia.  It is interesting that this happens with exertion or stretching.  Is happening frequently enough that we can check a 48-hour monitor. Check 2D echocardiogram to evaluate for structural normality given her obesity, likelihood of OSA and already known PVCs.

## 2018-02-08 NOTE — Assessment & Plan Note (Signed)
The pauses are more concerning than the PVC palpitations.  Need to exclude arrhythmia or symptomatic bradycardia. Plan: 48-hour monitor.

## 2018-02-08 NOTE — Assessment & Plan Note (Signed)
Borderline blood pressure today on her HCTZ and lisinopril.  We probably have plenty room to consider adding a beta-blocker such as bisoprolol, but he is now she is having any bradycardia episodes. Plan: 48-hour monitor. Also with long-standing hypertension we will check a 2D echo to exclude any hypertensive heart disease.

## 2018-02-08 NOTE — Assessment & Plan Note (Signed)
At risk for OSA.  With concerning symptoms, we will order home sleep study.  She was then follow-up with Dr. Tresa Endo if necessary for follow-up of OSA.

## 2018-02-08 NOTE — Assessment & Plan Note (Signed)
The tightness in her chest symptoms that she is feeling is really only when she has these weird pauses. We are monitoring with a 48-hour monitor and check a 2D echocardiogram.

## 2018-02-10 ENCOUNTER — Ambulatory Visit (INDEPENDENT_AMBULATORY_CARE_PROVIDER_SITE_OTHER): Payer: Medicare (Managed Care) | Admitting: Infectious Diseases

## 2018-02-10 ENCOUNTER — Encounter: Payer: Self-pay | Admitting: Infectious Diseases

## 2018-02-10 VITALS — BP 133/83 | HR 81 | Temp 97.4°F | Wt 294.0 lb

## 2018-02-10 DIAGNOSIS — I493 Ventricular premature depolarization: Secondary | ICD-10-CM

## 2018-02-10 DIAGNOSIS — B20 Human immunodeficiency virus [HIV] disease: Secondary | ICD-10-CM | POA: Diagnosis not present

## 2018-02-10 NOTE — Progress Notes (Signed)
Name: Katherine Howell DOB: [birth year] MRN: [id number] PCP: Izetta Dakin, NP ([facility]) Cardiologist: Bryan Lemma, MD  Chief Complaint  Patient presents with  . Follow-up    Routine HIV care     Patient Active Problem List   Diagnosis Date Noted  . Intermittent palpitations - with pauses 02/05/2018  . PVC's (premature ventricular contractions) 02/05/2018  . Daytime somnolence 02/05/2018  . Sensation of chest tightness 02/05/2018  . Leg cramps 07/28/2017  . Healthcare maintenance 07/28/2017  . Essential hypertension 05/19/2014  . Morbid obesity with BMI of 40.0-44.9, adult (HCC) 07/21/2012  . Human immunodeficiency virus (HIV) disease (HCC) 10/03/2011    Subjective:  Brief Narrative: Katherine Howell is a 57 y.o. AA female with HIV infection. Originally dx in 1998 with CD4 nadir of 10. HIV Risk: multiple blood transfusions in the past. OI Hx: unknown. Previously in care at [facility] with Dr. Manfred Arch. 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   Previous Regimens:   Combivir + Efavirenz --> viral load increasingly detectable (~3000copies)  2018 - Odefsey + Tivicay --> undetectable on regimen 02/2017  Genotype:   11/13/2016 - K103N and M184V mutation (R-efavirenze, nevirapine; R-lamivudine, emtricitabine)  HPI:  Interval history since our last visit together - Katherine Howell has had a colonoscopy in November which yielded a "good report" and recommendations to repeat in 10 years. She has also had some teeth extracted and troubled with frequent palpitations. She has been working with her cardiologist and is waiting to hear back about her heart monitor test. She and her PCP are also discussing a sleep study as she snores heavily and is having worsening daytime fatigue.   She has continued taking her HIV regimen of ODEFSEY +TIVICAY and is tolerating it well. Has not missed any doses since our last visit. Would like to discuss her  viral load from our last encounter to determine if things are improved on newer regimen. She feels as if she is not undetectable due to multiple subtle symptoms (incrased joint pain, fatigue). Reports no complaints today suggestive of associated opportunistic infection or advancing HIV disease such as fevers, night sweats, weight loss, anorexia, cough, SOB, nausea, vomiting, diarrhea, headache, sensory changes, lymphadenopathy or oral thrush.   She frequently takes herbal medications that are not listed on her chart as well as many other medications.   Review of Systems  Constitutional: Positive for malaise/fatigue. Negative for chills, fever and weight loss.  HENT: Negative for hearing loss and sore throat.        S/p multiple extractions. Waiting for dentures   Eyes: Negative.   Respiratory: Negative for cough and sputum production.   Cardiovascular: Positive for palpitations and leg swelling. Negative for chest pain.  Gastrointestinal: Negative for abdominal pain, diarrhea and vomiting.  Genitourinary: Negative for dysuria.  Musculoskeletal: Positive for joint pain (knees and ankles). Negative for myalgias and neck pain.       Uses walker at home   Skin: Negative for rash.  Neurological: Negative for dizziness and headaches.  Psychiatric/Behavioral: Negative for depression and substance abuse. The patient has insomnia. The patient is not nervous/anxious.     Past Medical History:  Diagnosis Date  . Allergy   . Anemia   . Arthritis   . Asthma   . Blood transfusion without reported diagnosis   . Chronic pain   . Depression   . Fibromyalgia   . Fracture of right femur (HCC)  Shatter bone - metal rod from right hip to right knee with 3 screws in rt knee and 1 in the rt hip.  02/12/2017  . GAD (generalized anxiety disorder)   . GERD (gastroesophageal reflux disease)   . HIV (human immunodeficiency virus infection) (HCC) 1998  . Hyperlipidemia   . Hypertension   . Insomnia   .  Obesity, morbid, BMI 40.0-49.9 (HCC)   . Osteoporosis   . Spinal stenosis    Essentially wheelchair-bound   Outpatient Medications Prior to Visit  Medication Sig Dispense Refill  . acetaminophen (TYLENOL) 500 MG tablet Take 1,000 mg by mouth.    Marland Kitchen albuterol (PROVENTIL HFA;VENTOLIN HFA) 108 (90 Base) MCG/ACT inhaler Inhale 2 puffs into the lungs every 4 (four) hours as needed for wheezing or shortness of breath.    Marland Kitchen aluminum-magnesium hydroxide-simethicone (MAALOX) 200-200-20 MG/5ML SUSP Take 30 mLs by mouth.    Marland Kitchen aspirin EC 81 MG tablet Take 81 mg by mouth daily.    . diphenhydrAMINE (BENADRYL) 25 MG tablet Take 25 mg by mouth every 8 (eight) hours as needed.    . dolutegravir (TIVICAY) 50 MG tablet Take 50 mg by mouth daily.    . DULoxetine (CYMBALTA) 30 MG capsule Take 30 mg by mouth daily.    Marland Kitchen emtricitabine-rilpivir-tenofovir AF (ODEFSEY) 200-25-25 MG TABS tablet Take 1 tablet by mouth daily with breakfast.    . EPINEPHrine (EPIPEN 2-PAK) 0.3 mg/0.3 mL IJ SOAJ injection Inject into the muscle once.    . hydrochlorothiazide (MICROZIDE) 12.5 MG capsule Take 12.5 mg by mouth daily.    Marland Kitchen ibuprofen (ADVIL,MOTRIN) 200 MG tablet Take 200 mg by mouth every 6 (six) hours as needed.    Marland Kitchen lisinopril (PRINIVIL,ZESTRIL) 10 MG tablet Take 10 mg by mouth daily.    Marland Kitchen loperamide (IMODIUM A-D) 2 MG tablet Take 2 mg by mouth.    . loratadine (CLARITIN) 10 MG tablet Take 10 mg by mouth daily.    Marland Kitchen OLANZapine (ZYPREXA) 2.5 MG tablet Take 2.5 mg by mouth at bedtime.    . ondansetron (ZOFRAN-ODT) 4 MG disintegrating tablet Take 4 mg by mouth every 8 (eight) hours as needed for nausea or vomiting.    . polyethylene glycol (MIRALAX / GLYCOLAX) packet Take 1 packet as needed by mouth.    . pregabalin (LYRICA) 75 MG capsule Take 75 mg by mouth 2 (two) times daily.    Marland Kitchen Spirulina 500 MG TABS Take 1 tablet daily by mouth.    . traZODone (DESYREL) 50 MG tablet Take 50 mg by mouth at bedtime.    . valACYclovir  (VALTREX) 1000 MG tablet Take 1 tablet (1,000 mg total) by mouth 2 (two) times daily. For a week 14 tablet 6  . vitamin C (ASCORBIC ACID) 500 MG tablet Take 500 mg by mouth daily.     No facility-administered medications prior to visit.    Social History   Tobacco Use  . Smoking status: Former Smoker    Types: Cigarettes    Last attempt to quit: 2010    Years since quitting: 9.1  . Smokeless tobacco: Never Used  Substance Use Topics  . Alcohol use: Yes    Comment: occ wine with dinner  . Drug use: No    Family History  Problem Relation Age of Onset  . Alzheimer's disease Mother   . CAD Mother        CABG x4  . Cirrhosis Father        Died at age  40  . CAD Maternal Grandmother        History of CABG  . Stroke Maternal Grandfather        4 total  . Heart disease Maternal Grandfather        Unsure of details  . Healthy Brother   . Colon cancer Neg Hx   . Esophageal cancer Neg Hx   . Rectal cancer Neg Hx   . Stomach cancer Neg Hx     Allergies  Allergen Reactions  . Iodine Anaphylaxis  . Other Anaphylaxis  . Shellfish-Derived Products Rash    Other reaction(s): Respiratory Distress (ALLERGY/intolerance), Shock (ALLERGY) SHRIMP-Rash] [Shock] [Resp]  . Diflunisal Other (See Comments)    Other reaction(s): Facial Edema (intolerance)  . Penicillins Other (See Comments)    Other] headache  . Sulfamethoxazole Rash  . Voltaren  [Diclofenac Sodium] Swelling    Unilateral leg swelling Unilateral leg swelling  . Diclofenac   . Lactose Intolerance (Gi)     Objective:   Vitals:   02/10/18 0948  BP: 133/83  Pulse: 81  Temp: (!) 97.4 F (36.3 C)  TempSrc: Oral  Weight: 294 lb (133.4 kg)   Body mass index is 47.45 kg/m.  Physical Exam  Constitutional: She is oriented to person, place, and time and well-developed, well-nourished, and in no distress.  Very pleasant obese woman seated in wheelchair.   HENT:  Mouth/Throat: Oropharynx is clear and moist. No oral  lesions. No dental abscesses.  Eyes: Pupils are equal, round, and reactive to light. No scleral icterus.  Neck: Neck supple.  Cardiovascular: Normal rate, normal heart sounds and intact distal pulses. An irregular rhythm present.  Occasional extrasystoles are present.  No murmur heard. Pulmonary/Chest: Effort normal and breath sounds normal. No respiratory distress.  Abdominal: Soft. Bowel sounds are normal. She exhibits no distension. There is no tenderness.  Musculoskeletal: Normal range of motion. She exhibits no tenderness.  Lymphadenopathy:    She has no cervical adenopathy.  Neurological: She is alert and oriented to person, place, and time.  Skin: Skin is warm and dry. No rash noted.  Psychiatric: Mood, affect and judgment normal.  Vitals reviewed.  Lab Results Lab Results  Component Value Date   CREATININE 0.67 07/28/2017   CREATININE 0.65 08/21/2009   CD4 T Cell Abs (/uL)  Date Value  07/28/2017 470   Lab Results  Component Value Date   HGBA1C 4.8 07/28/2017   Lab Results  Component Value Date   WBC 7.2 07/28/2017   HGB 11.9 07/28/2017   HCT 36.3 07/28/2017   MCV 95.5 07/28/2017   PLT 270 07/28/2017   Lab Results  Component Value Date   ALT 10 07/28/2017   AST 23 07/28/2017   ALKPHOS 88 07/28/2017   BILITOT 0.4 07/28/2017         Assessment & Plan:   Problem List Items Addressed This Visit      Cardiovascular and Mediastinum   PVC's (premature ventricular contractions) - Primary (Chronic)     Other   Human immunodeficiency virus (HIV) disease (HCC) (Chronic)    Tolerating her Odefsey and Tivicay well. She had low level viremia with labs last August with VL of 80 copies. I will reassess today. Reviewed her resistance history with our pharmacy team and she has fully active 3-drug regimen with dolutegravir, rilpivirine and tenofovir AF. I am suspicious that it is an absorption issue as she takes a lot of calcium-containing products and vitamins. I have  asked her to  pay particular attention to her other medications so her tivicay is fully absorbed. Would be best to separate from these other meds by 6h. She will reassess her medication regimen and adjust as recommended. Will have her return in 6 months to reassess or sooner if needed. I will call her with VL results.       Relevant Orders   HIV 1 RNA quant-no reflex-bld   T-helper cell (CD4)- ([facility] clinic only)     I spent 15 minutes with the patient including greater than 50% of time in face to face counsel of the patient re HIV, medication adherence, proper administration, review of labwork and in coordination of their care.  Rexene Alberts, MSN, NP-C [facility]   02/11/18 11:09 AM

## 2018-02-10 NOTE — Patient Instructions (Signed)
Continue taking your Odefsey and Tivicay every day.   Make sure you are not taking these medications at the same time as vitamins because it can change how it is absorbed.   Come back in 6 months - I will call you with your labs results in the next 1-2 weeks.

## 2018-02-11 LAB — T-HELPER CELL (CD4) - (RCID CLINIC ONLY)
CD4 T CELL ABS: 400 /uL (ref 400–2700)
CD4 T CELL HELPER: 31 % — AB (ref 33–55)

## 2018-02-11 NOTE — Assessment & Plan Note (Signed)
Tolerating her Charlett Lango and Tivicay well. She had low level viremia with labs last August with VL of 80 copies. I will reassess today. Reviewed her resistance history with our pharmacy team and she has fully active 3-drug regimen with dolutegravir, rilpivirine and tenofovir AF. I am suspicious that it is an absorption issue as she takes a lot of calcium-containing products and vitamins. I have asked her to pay particular attention to her other medications so her tivicay is fully absorbed. Would be best to separate from these other meds by 6h. She will reassess her medication regimen and adjust as recommended. Will have her return in 6 months to reassess or sooner if needed. I will call her with VL results.

## 2018-02-12 LAB — HIV-1 RNA QUANT-NO REFLEX-BLD
HIV 1 RNA Quant: 28 copies/mL — ABNORMAL HIGH
HIV-1 RNA QUANT, LOG: 1.45 {Log_copies}/mL — AB

## 2018-02-12 NOTE — Progress Notes (Signed)
Called to discuss lab results with Katherine Howell. I am very happy with how she has suppressed on her current regimen and will continue this for her. She will continue to make sure she takes them outside of calcium and other herbal supplements.

## 2018-02-17 ENCOUNTER — Other Ambulatory Visit: Payer: Self-pay

## 2018-02-17 ENCOUNTER — Telehealth: Payer: Self-pay | Admitting: *Deleted

## 2018-02-17 ENCOUNTER — Ambulatory Visit (INDEPENDENT_AMBULATORY_CARE_PROVIDER_SITE_OTHER): Payer: Medicare (Managed Care)

## 2018-02-17 ENCOUNTER — Ambulatory Visit (HOSPITAL_COMMUNITY): Payer: Medicare (Managed Care) | Attending: Cardiology

## 2018-02-17 DIAGNOSIS — E785 Hyperlipidemia, unspecified: Secondary | ICD-10-CM | POA: Insufficient documentation

## 2018-02-17 DIAGNOSIS — M797 Fibromyalgia: Secondary | ICD-10-CM | POA: Insufficient documentation

## 2018-02-17 DIAGNOSIS — R002 Palpitations: Secondary | ICD-10-CM

## 2018-02-17 DIAGNOSIS — J449 Chronic obstructive pulmonary disease, unspecified: Secondary | ICD-10-CM | POA: Insufficient documentation

## 2018-02-17 DIAGNOSIS — R0789 Other chest pain: Secondary | ICD-10-CM

## 2018-02-17 DIAGNOSIS — M069 Rheumatoid arthritis, unspecified: Secondary | ICD-10-CM | POA: Insufficient documentation

## 2018-02-17 DIAGNOSIS — I493 Ventricular premature depolarization: Secondary | ICD-10-CM

## 2018-02-17 DIAGNOSIS — B20 Human immunodeficiency virus [HIV] disease: Secondary | ICD-10-CM | POA: Diagnosis not present

## 2018-02-17 DIAGNOSIS — I10 Essential (primary) hypertension: Secondary | ICD-10-CM | POA: Diagnosis not present

## 2018-02-17 NOTE — Telephone Encounter (Signed)
Patient's primary care provider calling for lab results from 3/5, needs them for upcoming dental clinic visit. RN gave verbal results, faxed results to [phone]. Andree Coss, RN

## 2018-02-18 NOTE — Progress Notes (Signed)
Echo results: Good news: Essentially normal echocardiogram and normal pump function and normal valve function.  EF: 55-60%. No regional wall motion abnormalities suggests no prior heart attack. The ascending aorta appears to be mildly dilated -we can follow this up next year to ensure that it is not getting larger.  If it stays elevated may need to consider CT cystic focus on the aorta.  Nothing abnormal here to explain shortness of breath, palpitations or irregular heartbeats.  Nothing to explain shortness of breath, dizziness or lightheadedness.  Bryan Lemma, MD   Please forward to PCP: Izetta Dakin, NP

## 2018-02-27 ENCOUNTER — Encounter (HOSPITAL_BASED_OUTPATIENT_CLINIC_OR_DEPARTMENT_OTHER): Payer: Medicare (Managed Care)

## 2018-04-06 ENCOUNTER — Ambulatory Visit: Payer: Medicare (Managed Care) | Admitting: Cardiology

## 2018-08-24 ENCOUNTER — Encounter: Payer: Self-pay | Admitting: Infectious Diseases

## 2018-08-24 ENCOUNTER — Ambulatory Visit (INDEPENDENT_AMBULATORY_CARE_PROVIDER_SITE_OTHER): Payer: Medicare (Managed Care) | Admitting: Infectious Diseases

## 2018-08-24 VITALS — BP 143/93 | HR 82 | Temp 98.1°F | Wt 295.0 lb

## 2018-08-24 DIAGNOSIS — B20 Human immunodeficiency virus [HIV] disease: Secondary | ICD-10-CM | POA: Diagnosis not present

## 2018-08-24 DIAGNOSIS — Z0000 Encounter for general adult medical examination without abnormal findings: Secondary | ICD-10-CM | POA: Diagnosis not present

## 2018-08-24 NOTE — Assessment & Plan Note (Signed)
Due for mammogram and pap smear in October of this year - discussed current DHHS HIV recommendations around pap smear screenings and if she has 3 consecutive yearly negative (cytology and oncogenic HPV) would space out to q3y interval. It is recommended to screen beyond 59yo for women living with HIV.  Flu vaccine to be given with [facility].

## 2018-08-24 NOTE — Assessment & Plan Note (Addendum)
Doing a better job adjusting timing of meds with supplements she likes to take. Reviewed med list and no interaction noted today. Will check VL and CD4 today. Continue Odefsey and Tivicay regimen once a day with food.  Will have her come back in 6 months for routine care.

## 2018-08-24 NOTE — Progress Notes (Signed)
Name: Katherine Howell DOB: 1961-09-14 MRN: [id number] PCP: Izetta Dakin, NP ([facility]) Cardiologist: Bryan Lemma, MD  Patient Active Problem List   Diagnosis Date Noted  . Intermittent palpitations - with pauses 02/05/2018  . PVC's (premature ventricular contractions) 02/05/2018  . Daytime somnolence 02/05/2018  . Sensation of chest tightness 02/05/2018  . Leg cramps 07/28/2017  . Healthcare maintenance 07/28/2017  . Essential hypertension 05/19/2014  . Morbid obesity with BMI of 40.0-44.9, adult (HCC) 07/21/2012  . Human immunodeficiency virus (HIV) disease (HCC) 10/03/2011    Subjective:  Brief Narrative: Katherine Howell is a 57 y.o. AA female with HIV infection. Originally dx in 1998 with CD4 nadir of 10. HIV Risk: multiple blood transfusions in the past. OI Hx: unknown. Previously in care at [facility] with Dr. Manfred Arch. 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   Previous Regimens:   Combivir + Efavirenz --> viral load increasingly detectable (~3000copies)  2018 - Odefsey + Tivicay --> undetectable on regimen 02/2017  Genotype:   11/13/2016 - K103N and M184V mutation (R-efavirenze, nevirapine; R-lamivudine, emtricitabine)  Chief Complaint  Patient presents with  . Follow-up    HIV    HPI/ROS:  Interval history since our last visit together - has taken herself off her Cymbalta as she has "adjusted her mindset and re-invented herself" to where she no longer feels she is in a depressed state. 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 Still caring for her mother with dementia and son whom is special needs. She feels very happy and proud of her progress on self-worth.   She has continued taking her HIV regimen of ODEFSEY +TIVICAY and is tolerating it well. Has not missed any doses since our last visit and has really paid particular attention to  avoiding combining supplements with meds. Reports no complaints today suggestive of associated opportunistic infection or advancing HIV disease such as fevers, night sweats, weight loss, anorexia, cough, SOB, nausea, vomiting, diarrhea, headache, sensory changes, lymphadenopathy or oral thrush.   Review of Systems  Constitutional: Negative for chills, fever, malaise/fatigue and weight loss.  HENT: Negative for hearing loss and sore throat.   Eyes: Negative.   Respiratory: Negative for cough and sputum production.   Cardiovascular: Positive for palpitations and leg swelling (occasionally but improved). Negative for chest pain.  Gastrointestinal: Negative for abdominal pain, diarrhea and vomiting.  Genitourinary: Negative for dysuria.  Musculoskeletal: Positive for joint pain (knees and ankles). Negative for myalgias and neck pain.       Uses walker at home   Skin: Negative for rash.  Neurological: Negative for dizziness and headaches.  Psychiatric/Behavioral: Negative for depression and substance abuse. The patient is not nervous/anxious and does not have insomnia.     Past Medical History:  Diagnosis Date  . Allergy   . Anemia   . Arthritis   . Asthma   . Blood transfusion without reported diagnosis   . Chronic pain   . Depression   . Fibromyalgia   . Fracture of right femur (HCC)    Shatter bone - metal rod from right hip to right knee with 3 screws in rt knee and 1 in the rt hip.  02/12/2017  . GAD (generalized anxiety disorder)   . GERD (gastroesophageal reflux disease)   . HIV (human immunodeficiency virus infection) (HCC) 1998  . Hyperlipidemia   . Hypertension   .  Insomnia   . Obesity, morbid, BMI 40.0-49.9 (HCC)   . Osteoporosis   . Spinal stenosis    Essentially wheelchair-bound   Outpatient Medications Prior to Visit  Medication Sig Dispense Refill  . acetaminophen (TYLENOL) 500 MG tablet Take 1,000 mg by mouth.    Marland Kitchen albuterol (PROVENTIL HFA;VENTOLIN HFA) 108 (90  Base) MCG/ACT inhaler Inhale 2 puffs into the lungs every 4 (four) hours as needed for wheezing or shortness of breath.    Marland Kitchen aluminum-magnesium hydroxide-simethicone (MAALOX) 200-200-20 MG/5ML SUSP Take 30 mLs by mouth.    Marland Kitchen aspirin EC 81 MG tablet Take 81 mg by mouth daily.    . diphenhydrAMINE (BENADRYL) 25 MG tablet Take 25 mg by mouth every 8 (eight) hours as needed.    . dolutegravir (TIVICAY) 50 MG tablet Take 50 mg by mouth daily.    Marland Kitchen emtricitabine-rilpivir-tenofovir AF (ODEFSEY) 200-25-25 MG TABS tablet Take 1 tablet by mouth daily with breakfast.    . EPINEPHrine (EPIPEN 2-PAK) 0.3 mg/0.3 mL IJ SOAJ injection Inject into the muscle once.    . hydrochlorothiazide (MICROZIDE) 12.5 MG capsule Take 12.5 mg by mouth daily.    Marland Kitchen ibuprofen (ADVIL,MOTRIN) 200 MG tablet Take 200 mg by mouth every 6 (six) hours as needed.    Marland Kitchen lisinopril (PRINIVIL,ZESTRIL) 10 MG tablet Take 10 mg by mouth daily.    Marland Kitchen loperamide (IMODIUM A-D) 2 MG tablet Take 2 mg by mouth.    . loratadine (CLARITIN) 10 MG tablet Take 10 mg by mouth daily.    Marland Kitchen OLANZapine (ZYPREXA) 2.5 MG tablet Take 2.5 mg by mouth at bedtime.    . ondansetron (ZOFRAN-ODT) 4 MG disintegrating tablet Take 4 mg by mouth every 8 (eight) hours as needed for nausea or vomiting.    . pregabalin (LYRICA) 75 MG capsule Take 75 mg by mouth 2 (two) times daily.    Marland Kitchen Spirulina 500 MG TABS Take 1 tablet daily by mouth.    . traZODone (DESYREL) 50 MG tablet Take 50 mg by mouth at bedtime.    . DULoxetine (CYMBALTA) 30 MG capsule Take 30 mg by mouth daily.    . polyethylene glycol (MIRALAX / GLYCOLAX) packet Take 1 packet as needed by mouth.    . valACYclovir (VALTREX) 1000 MG tablet Take 1 tablet (1,000 mg total) by mouth 2 (two) times daily. For a week (Patient not taking: Reported on 08/24/2018) 14 tablet 6  . vitamin C (ASCORBIC ACID) 500 MG tablet Take 500 mg by mouth daily.     No facility-administered medications prior to visit.    Social History    Tobacco Use  . Smoking status: Former Smoker    Types: Cigarettes    Last attempt to quit: 2010    Years since quitting: 9.7  . Smokeless tobacco: Never Used  Substance Use Topics  . Alcohol use: Yes    Comment: occ wine with dinner  . Drug use: No    Family History  Problem Relation Age of Onset  . Alzheimer's disease Mother   . CAD Mother        CABG x4  . Cirrhosis Father        Died at age 24  . CAD Maternal Grandmother        History of CABG  . Stroke Maternal Grandfather        4 total  . Heart disease Maternal Grandfather        Unsure of details  . Healthy Brother   .  Colon cancer Neg Hx   . Esophageal cancer Neg Hx   . Rectal cancer Neg Hx   . Stomach cancer Neg Hx     Allergies  Allergen Reactions  . Iodine Anaphylaxis  . Other Anaphylaxis  . Shellfish-Derived Products Rash    Other reaction(s): Respiratory Distress (ALLERGY/intolerance), Shock (ALLERGY) SHRIMP-Rash] [Shock] [Resp]  . Diflunisal Other (See Comments)    Other reaction(s): Facial Edema (intolerance)  . Penicillins Other (See Comments)    Other] headache  . Sulfamethoxazole Rash  . Voltaren  [Diclofenac Sodium] Swelling    Unilateral leg swelling Unilateral leg swelling  . Diclofenac   . Lactose Intolerance (Gi)     Objective:   Vitals:   08/24/18 0943  BP: (!) 143/93  Pulse: 82  Temp: 98.1 F (36.7 C)  TempSrc: Oral  Weight: 295 lb (133.8 kg)   Body mass index is 47.61 kg/m.  Physical Exam  Constitutional: She is oriented to person, place, and time and well-developed, well-nourished, and in no distress.  Very pleasant woman seated in wheelchair.   HENT:  Mouth/Throat: Oropharynx is clear and moist. No oral lesions. No dental abscesses.  Eyes: Pupils are equal, round, and reactive to light. No scleral icterus.  Neck: Neck supple.  Cardiovascular: Normal rate, normal heart sounds and intact distal pulses. An irregular rhythm present.  Occasional extrasystoles are  present.  No murmur heard. Pulmonary/Chest: Effort normal and breath sounds normal. No respiratory distress.  Abdominal: Soft. Bowel sounds are normal. She exhibits no distension. There is no tenderness.  Musculoskeletal: Normal range of motion. She exhibits no tenderness.  Lymphadenopathy:    She has no cervical adenopathy.  Neurological: She is alert and oriented to person, place, and time.  Skin: Skin is warm and dry. No rash noted.  Psychiatric: Mood, affect and judgment normal.  Vitals reviewed.  Lab Results Lab Results  Component Value Date   CREATININE 0.67 07/28/2017   CREATININE 0.65 08/21/2009   HIV 1 RNA Quant (copies/mL)  Date Value  02/10/2018 28 (H)   CD4 T Cell Abs (/uL)  Date Value  02/10/2018 400  07/28/2017 470   Lab Results  Component Value Date   HGBA1C 4.8 07/28/2017   Lab Results  Component Value Date   WBC 7.2 07/28/2017   HGB 11.9 07/28/2017   HCT 36.3 07/28/2017   MCV 95.5 07/28/2017   PLT 270 07/28/2017   Lab Results  Component Value Date   ALT 10 07/28/2017   AST 23 07/28/2017   ALKPHOS 88 07/28/2017   BILITOT 0.4 07/28/2017   HIV 1 RNA Quant (copies/mL)  Date Value  02/10/2018 28 (H)   CD4 T Cell Abs (/uL)  Date Value  02/10/2018 400  07/28/2017 470         Assessment & Plan:   Problem List Items Addressed This Visit      Other   Human immunodeficiency virus (HIV) disease (HCC) - Primary (Chronic)    Doing a better job adjusting timing of meds with supplements she likes to take. Reviewed med list and no interaction noted today. Will check VL and CD4 today. Continue Odefsey and Tivicay regimen once a day with food.  Will have her come back in 6 months for routine care.       Relevant Orders   HIV-1 RNA quant-no reflex-bld   T-helper cell (CD4)- ([facility] only)   Healthcare maintenance    Due for mammogram and pap smear in October of this year -  discussed current DHHS HIV recommendations around pap smear  screenings and if she has 3 consecutive yearly negative (cytology and oncogenic HPV) would space out to q3y interval. It is recommended to screen beyond 59yo for women living with HIV.  Flu vaccine to be given with [facility].         Rexene Alberts, MSN, NP-C [facility]   08/24/18 11:26 AM

## 2018-08-24 NOTE — Patient Instructions (Signed)
Wonderful to see you! Continue your Katherine Howell and Katherine Howell please talk with [facility] about flu shot, pap smear, and mammogram. Please have records faxed to our office [phone].  Follow up in 6 months.

## 2018-08-25 LAB — T-HELPER CELL (CD4) - (RCID CLINIC ONLY)
CD4 % Helper T Cell: 28 % — ABNORMAL LOW (ref 33–55)
CD4 T Cell Abs: 310 /uL — ABNORMAL LOW (ref 400–2700)

## 2018-08-26 ENCOUNTER — Other Ambulatory Visit: Payer: Self-pay | Admitting: Gerontology

## 2018-08-26 DIAGNOSIS — Z1231 Encounter for screening mammogram for malignant neoplasm of breast: Secondary | ICD-10-CM

## 2018-08-26 LAB — HIV-1 RNA QUANT-NO REFLEX-BLD
HIV 1 RNA QUANT: 48 {copies}/mL — AB
HIV-1 RNA Quant, Log: 1.68 Log copies/mL — ABNORMAL HIGH

## 2018-09-17 ENCOUNTER — Encounter: Payer: Self-pay | Admitting: *Deleted

## 2018-09-22 ENCOUNTER — Other Ambulatory Visit: Payer: Self-pay | Admitting: Gerontology

## 2018-09-22 ENCOUNTER — Ambulatory Visit
Admission: RE | Admit: 2018-09-22 | Discharge: 2018-09-22 | Disposition: A | Discharge status: Home / Self Care | Payer: Medicare (Managed Care) | Source: Ambulatory Visit | Attending: Gerontology | Admitting: Gerontology

## 2018-09-22 DIAGNOSIS — M159 Polyosteoarthritis, unspecified: Secondary | ICD-10-CM

## 2018-10-01 ENCOUNTER — Ambulatory Visit
Admission: RE | Admit: 2018-10-01 | Discharge: 2018-10-01 | Disposition: A | Discharge status: Home / Self Care | Payer: Medicare (Managed Care) | Source: Ambulatory Visit | Attending: Gerontology | Admitting: Gerontology

## 2018-10-01 DIAGNOSIS — Z1231 Encounter for screening mammogram for malignant neoplasm of breast: Secondary | ICD-10-CM

## 2018-10-08 ENCOUNTER — Ambulatory Visit: Payer: Medicare (Managed Care) | Admitting: Obstetrics & Gynecology

## 2019-01-13 ENCOUNTER — Ambulatory Visit (INDEPENDENT_AMBULATORY_CARE_PROVIDER_SITE_OTHER): Payer: Medicare (Managed Care) | Admitting: Obstetrics & Gynecology

## 2019-01-13 VITALS — BP 142/88 | HR 79 | Wt 306.0 lb

## 2019-01-13 DIAGNOSIS — Z1151 Encounter for screening for human papillomavirus (HPV): Secondary | ICD-10-CM | POA: Diagnosis not present

## 2019-01-13 DIAGNOSIS — Z124 Encounter for screening for malignant neoplasm of cervix: Secondary | ICD-10-CM | POA: Diagnosis not present

## 2019-01-13 DIAGNOSIS — Z01419 Encounter for gynecological examination (general) (routine) without abnormal findings: Secondary | ICD-10-CM

## 2019-01-13 NOTE — Progress Notes (Signed)
Subjective:    Katherine Howell is a 58 y.o. single P80 (79 yo son) female who presents for an annual exam. The patient has no complaints today. The patient is not currently sexually active. GYN screening history: last pap: was normal. The patient wears seatbelts: yes. The patient participates in regular exercise: no. Has the patient ever been transfused or tattooed?: no. The patient reports that there is not domestic violence in her life.   Menstrual History: OB History   No obstetric history on file.      No LMP recorded. Patient is postmenopausal.    The following portions of the patient's history were reviewed and updated as appropriate: allergies, current medications, past family history, past medical history, past social history, past surgical history and problem list.  Review of Systems Pertinent items are noted in HPI.   FH- no breast/gyn/colon cancer S/p colonoscopy in 2019 Wheelchair bound Mammogram 10/19   Objective:    BP (!) 142/88   Pulse 79   Wt (!) 306 lb (138.8 kg)   BMI 49.39 kg/m   General Appearance:    Alert, cooperative, no distress, appears stated age  Head:    Normocephalic, without obvious abnormality, atraumatic  Eyes:    PERRL, conjunctiva/corneas clear, EOM's intact, fundi    benign, both eyes  Ears:    Normal TM's and external ear canals, both ears  Nose:   Nares normal, septum midline, mucosa normal, no drainage    or sinus tenderness  Throat:   Lips, mucosa, and tongue normal; teeth and gums normal  Neck:   Supple, symmetrical, trachea midline, no adenopathy;    thyroid:  no enlargement/tenderness/nodules; no carotid   bruit or JVD  Back:     Symmetric, no curvature, ROM normal, no CVA tenderness  Lungs:     Clear to auscultation bilaterally, respirations unlabored  Chest Wall:    No tenderness or deformity   Heart:    Regular rate and rhythm, S1 and S2 normal, no murmur, rub   or gallop  Breast Exam:    No tenderness, masses, or nipple  abnormality  Abdomen:     Soft, non-tender, bowel sounds active all four quadrants,    no masses, no organomegaly  Genitalia:    Normal female without lesion, discharge or tenderness, cervix appears normal, normal discharge     Extremities:   Extremities normal, atraumatic, no cyanosis or edema  Pulses:   2+ and symmetric all extremities  Skin:   Skin color, texture, turgor normal, no rashes or lesions  Lymph nodes:   Cervical, supraclavicular, and axillary nodes normal  Neurologic:   CNII-XII intact, normal strength, sensation and reflexes    throughout  .    Assessment:    Healthy female exam.    Plan:     Thin prep Pap smear. with cotesting

## 2019-01-18 LAB — CYTOLOGY - PAP
Diagnosis: NEGATIVE
HPV: NOT DETECTED

## 2019-02-09 ENCOUNTER — Telehealth: Payer: Self-pay | Admitting: Behavioral Health

## 2019-02-09 NOTE — Telephone Encounter (Signed)
Patient's PCP called Izetta Dakin NP called stating that Ms. Whirley's [facility] card is not being accepted for her to get lab work.  Izetta Dakin states she will order the labs needed for [facility] and fax them back.  She states needs a RX faxed to [facility] at [phone].

## 2019-02-09 NOTE — Telephone Encounter (Signed)
Rx written for cmp, RPR, hiv VL and CD4.  Thanks for faxing!

## 2019-02-17 ENCOUNTER — Encounter: Payer: Self-pay | Admitting: Infectious Diseases

## 2019-02-17 ENCOUNTER — Ambulatory Visit (INDEPENDENT_AMBULATORY_CARE_PROVIDER_SITE_OTHER): Payer: Medicare (Managed Care) | Admitting: Infectious Diseases

## 2019-02-17 ENCOUNTER — Other Ambulatory Visit: Payer: Self-pay

## 2019-02-17 VITALS — BP 118/83 | HR 120 | Temp 98.1°F | Wt 304.0 lb

## 2019-02-17 DIAGNOSIS — B20 Human immunodeficiency virus [HIV] disease: Secondary | ICD-10-CM

## 2019-02-17 DIAGNOSIS — Z0000 Encounter for general adult medical examination without abnormal findings: Secondary | ICD-10-CM | POA: Diagnosis not present

## 2019-02-17 NOTE — Progress Notes (Signed)
Name: Katherine Howell DOB: 08/22/1961 MRN: [id number] PCP: Izetta Dakin, NP ([facility]) Cardiologist: Bryan Lemma, MD  Patient Active Problem List   Diagnosis Date Noted  . Intermittent palpitations - with pauses 02/05/2018  . PVC's (premature ventricular contractions) 02/05/2018  . Daytime somnolence 02/05/2018  . Sensation of chest tightness 02/05/2018  . Leg cramps 07/28/2017  . Healthcare maintenance 07/28/2017  . Essential hypertension 05/19/2014  . Morbid obesity with BMI of 40.0-44.9, adult (HCC) 07/21/2012  . Human immunodeficiency virus (HIV) disease (HCC) 10/03/2011    Subjective:  Brief Narrative: Katherine Howell is a 58 y.o. AA female with HIV infection. Originally dx in 1998 with CD4 nadir of 10.  HIV Risk: multiple blood transfusions in the past. OI Hx: unknown. Previously in care at [facility] with Dr. Manfred Arch. 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   HPI/ROS:  Katherine Howell has been doing well lately. Increased stress but better now surrounding her mother - she has been falling at home and out and about and Joel has now placed her in a skilled facility for care as she can no longer provide this for her safely. She is at peace with this decision now and enjoying the ability to now take care of her own affairs easier. She has no changes to her health history since our last office visit together. Improved depression symptoms and she is happy. She is taking her Odefsey and Tivicay every day as prescribed with a full meal. She has not missed a single dose since our last visit.  She has a pap smear in  February which was normal.  Colonoscopy was done a while back and was given "10-year follow up."    Review of Systems  Constitutional: Negative for chills, fever, malaise/fatigue and weight loss.  HENT: Negative for hearing loss and sore throat.   Eyes: Negative.   Respiratory: Negative for cough and sputum production.   Cardiovascular: Positive for leg swelling (occasionally ). Negative for chest pain and palpitations.  Gastrointestinal: Negative for abdominal pain, diarrhea and vomiting.  Genitourinary: Negative for dysuria.  Musculoskeletal: Positive for joint pain (knees and ankles). Negative for myalgias and neck pain.       Uses walker at home   Skin: Negative for rash.  Neurological: Negative for dizziness and headaches.  Psychiatric/Behavioral: Negative for depression and substance abuse. The patient is not nervous/anxious and does not have insomnia.     Past Medical History:  Diagnosis Date  . Allergy   . Anemia   . Arthritis   . Asthma   . Blood transfusion without reported diagnosis   . Chronic pain   . Depression   . Fibromyalgia   . Fracture of right femur (HCC)    Shatter bone - metal rod from right hip to right knee with 3 screws in rt knee and 1 in the rt hip.  02/12/2017  . GAD (generalized anxiety disorder)   . GERD (gastroesophageal reflux disease)   . HIV (human immunodeficiency virus infection) (HCC) 1998  . Hyperlipidemia   . Hypertension   . Insomnia   . Obesity, morbid, BMI 40.0-49.9 (HCC)   . Osteoporosis   .  Spinal stenosis    Essentially wheelchair-bound   Outpatient Medications Prior to Visit  Medication Sig Dispense Refill  . acetaminophen (TYLENOL) 500 MG tablet Take 1,000 mg by mouth.    Marland Kitchen albuterol (PROVENTIL HFA;VENTOLIN HFA) 108 (90 Base) MCG/ACT inhaler Inhale 2 puffs into the lungs every 4 (four) hours as needed for wheezing or shortness of breath.    Marland Kitchen aluminum-magnesium hydroxide-simethicone (MAALOX) 200-200-20 MG/5ML SUSP Take 30 mLs  by mouth.    Marland Kitchen aspirin EC 81 MG tablet Take 81 mg by mouth daily.    . diphenhydrAMINE (BENADRYL) 25 MG tablet Take 25 mg by mouth every 8 (eight) hours as needed.    . dolutegravir (TIVICAY) 50 MG tablet Take 50 mg by mouth daily.    . DULoxetine (CYMBALTA) 30 MG capsule Take 30 mg by mouth daily.    Marland Kitchen emtricitabine-rilpivir-tenofovir AF (ODEFSEY) 200-25-25 MG TABS tablet Take 1 tablet by mouth daily with breakfast.    . EPINEPHrine (EPIPEN 2-PAK) 0.3 mg/0.3 mL IJ SOAJ injection Inject into the muscle once.    . hydrochlorothiazide (MICROZIDE) 12.5 MG capsule Take 12.5 mg by mouth daily.    Marland Kitchen ibuprofen (ADVIL,MOTRIN) 200 MG tablet Take 200 mg by mouth every 6 (six) hours as needed.    Marland Kitchen lisinopril (PRINIVIL,ZESTRIL) 10 MG tablet Take 10 mg by mouth daily.    Marland Kitchen loperamide (IMODIUM A-D) 2 MG tablet Take 2 mg by mouth.    . loratadine (CLARITIN) 10 MG tablet Take 10 mg by mouth daily.    . Magnesium (V-R MAGNESIUM) 250 MG TABS Take by mouth.    . montelukast (SINGULAIR) 10 MG tablet Take by mouth.    . OLANZapine (ZYPREXA) 2.5 MG tablet Take 2.5 mg by mouth at bedtime.    . ondansetron (ZOFRAN-ODT) 4 MG disintegrating tablet Take 4 mg by mouth every 8 (eight) hours as needed for nausea or vomiting.    . polyethylene glycol (MIRALAX / GLYCOLAX) packet Take 1 packet as needed by mouth.    . pregabalin (LYRICA) 75 MG capsule Take 75 mg by mouth 2 (two) times daily.    Marland Kitchen Spirulina 500 MG TABS Take 1 tablet daily by mouth.    . traZODone (DESYREL) 50 MG tablet Take 50 mg by mouth at bedtime.    . valACYclovir (VALTREX) 1000 MG tablet Take 1 tablet (1,000 mg total) by mouth 2 (two) times daily. For a week 14 tablet 6  . vitamin C (ASCORBIC ACID) 500 MG tablet Take 500 mg by mouth daily.     No facility-administered medications prior to visit.    Social History   Tobacco Use  . Smoking status: Former Smoker    Types: Cigarettes    Last attempt to quit: 2010    Years since quitting: 10.2  .  Smokeless tobacco: Never Used  Substance Use Topics  . Alcohol use: Yes    Comment: occ wine with dinner  . Drug use: No    Family History  Problem Relation Age of Onset  . Alzheimer's disease Mother   . CAD Mother        CABG x4  . Cirrhosis Father        Died at age 61  . CAD Maternal Grandmother        History of CABG  . Stroke Maternal Grandfather        4 total  . Heart disease Maternal Grandfather        Unsure of details  . Healthy  Brother   . Colon cancer Neg Hx   . Esophageal cancer Neg Hx   . Rectal cancer Neg Hx   . Stomach cancer Neg Hx     Allergies  Allergen Reactions  . Iodinated Diagnostic Agents Anaphylaxis  . Iodine Anaphylaxis  . Other Anaphylaxis  . Shellfish Allergy Rash and Anaphylaxis    SHRIMP-Rash] [Shock] [Resp]  . Shellfish-Derived Products Rash    Other reaction(s): Respiratory Distress (ALLERGY/intolerance), Shock (ALLERGY) SHRIMP-Rash] [Shock] [Resp]  . Diflunisal Other (See Comments) and Swelling    Other reaction(s): Facial Edema (intolerance)  . Penicillins Other (See Comments)    Other] headache  . Sulfamethoxazole Rash  . Voltaren  [Diclofenac Sodium] Swelling    Unilateral leg swelling Unilateral leg swelling  . Diclofenac   . Lactose Intolerance (Gi)     Objective:   Vitals:   02/17/19 1441  BP: 118/83  Pulse: (!) 120  Temp: 98.1 F (36.7 C)  Weight: (!) 304 lb (137.9 kg)   Body mass index is 49.07 kg/m.  Physical Exam  Constitutional: She is oriented to person, place, and time and well-developed, well-nourished, and in no distress.  Very pleasant woman seated in wheelchair.   HENT:  Mouth/Throat: Oropharynx is clear and moist. No oral lesions. No dental abscesses.  Eyes: Pupils are equal, round, and reactive to light. No scleral icterus.  Neck: Neck supple.  Cardiovascular: Normal rate, regular rhythm, normal heart sounds and intact distal pulses.  No murmur heard. Pulmonary/Chest: Effort normal and breath  sounds normal. No respiratory distress.  Abdominal: Soft. Bowel sounds are normal. She exhibits no distension. There is no abdominal tenderness.  Musculoskeletal: Normal range of motion.        General: No tenderness.  Lymphadenopathy:    She has no cervical adenopathy.  Neurological: She is alert and oriented to person, place, and time.  Skin: Skin is warm and dry. No rash noted.  Psychiatric: Mood, affect and judgment normal.  Vitals reviewed.  Lab Results Lab Results  Component Value Date   CREATININE 0.67 07/28/2017   CREATININE 0.65 08/21/2009   HIV 1 RNA Quant (copies/mL)  Date Value  08/24/2018 48 (H)  02/10/2018 28 (H)   CD4 T Cell Abs (/uL)  Date Value  08/24/2018 310 (L)  02/10/2018 400  07/28/2017 470   Lab Results  Component Value Date   HGBA1C 4.8 07/28/2017   Lab Results  Component Value Date   WBC 7.2 07/28/2017   HGB 11.9 07/28/2017   HCT 36.3 07/28/2017   MCV 95.5 07/28/2017   PLT 270 07/28/2017   Lab Results  Component Value Date   ALT 10 07/28/2017   AST 23 07/28/2017   ALKPHOS 88 07/28/2017   BILITOT 0.4 07/28/2017   HIV 1 RNA Quant (copies/mL)  Date Value  08/24/2018 48 (H)  02/10/2018 28 (H)   CD4 T Cell Abs (/uL)  Date Value  08/24/2018 310 (L)  02/10/2018 400  07/28/2017 470         Assessment & Plan:   Problem List Items Addressed This Visit      Unprioritized   Human immunodeficiency virus (HIV) disease (HCC) - Primary (Chronic)    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 She could probably be maintained on Biktarvy alone but she likes the regimen she is on currently and is used to it now. She has labs done through [facility]. Unfortunately was not able to generate an absolute CD4 but likely > 500 with overall percentage @34 .  Her VL is < 40.  Discussed U=U concept in addition to safe sex counseling and prevention of other STIs.   She can return to clinic in 1 year.       Healthcare maintenance    She is up to date on vaccines  and pap smear. Would recommend she continue to undergo q88yr pap smears beyond the age of 60 yo as the recommendations differ a bit for women with HIV disease.         Rexene Alberts, MSN, NP-C [facility]   02/18/19

## 2019-02-17 NOTE — Patient Instructions (Signed)
You are doing very well on your [place] and Tivicay. Please continue to take both of these medications every day with food.   Medications to avoid on this regimen - no acid reducing medications (Tums, Rolaids, Protonix, Pepcid, or like products). These are contraindicated with your HIV medicaiton  OK to return back in 1 year with labs prior to - we can fax a prescription to [facility] to draw before your visit.

## 2019-02-18 ENCOUNTER — Encounter: Payer: Self-pay | Admitting: Infectious Diseases

## 2019-02-18 NOTE — Assessment & Plan Note (Signed)
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 She could probably be maintained on Biktarvy alone but she likes the regimen she is on currently and is used to it now. She has labs done through [facility]. Unfortunately was not able to generate an absolute CD4 but likely > 500 with overall percentage @34 . Her VL is < 40.  Discussed U=U concept in addition to safe sex counseling and prevention of other STIs.   She can return to clinic in 1 year.

## 2019-02-18 NOTE — Assessment & Plan Note (Signed)
She is up to date on vaccines and pap smear. Would recommend she continue to undergo q49yr pap smears beyond the age of 60 yo as the recommendations differ a bit for women with HIV disease.

## 2019-06-17 ENCOUNTER — Other Ambulatory Visit: Payer: Self-pay | Admitting: Gerontology

## 2019-06-17 DIAGNOSIS — Z1231 Encounter for screening mammogram for malignant neoplasm of breast: Secondary | ICD-10-CM

## 2019-06-18 ENCOUNTER — Other Ambulatory Visit: Payer: Self-pay | Admitting: Family Medicine

## 2019-06-18 DIAGNOSIS — R5381 Other malaise: Secondary | ICD-10-CM

## 2019-06-21 ENCOUNTER — Other Ambulatory Visit: Payer: Self-pay | Admitting: Family Medicine

## 2019-06-21 DIAGNOSIS — E2839 Other primary ovarian failure: Secondary | ICD-10-CM

## 2019-10-04 ENCOUNTER — Other Ambulatory Visit: Payer: Medicare (Managed Care)

## 2019-10-04 ENCOUNTER — Ambulatory Visit
Admission: RE | Admit: 2019-10-04 | Discharge: 2019-10-04 | Disposition: A | Discharge status: Home / Self Care | Payer: Medicare (Managed Care) | Source: Ambulatory Visit | Attending: Gerontology | Admitting: Gerontology

## 2019-10-04 ENCOUNTER — Ambulatory Visit: Payer: Medicare (Managed Care)

## 2019-10-04 ENCOUNTER — Other Ambulatory Visit: Payer: Self-pay

## 2019-10-04 DIAGNOSIS — Z1231 Encounter for screening mammogram for malignant neoplasm of breast: Secondary | ICD-10-CM

## 2019-12-30 ENCOUNTER — Other Ambulatory Visit: Payer: Medicare (Managed Care)

## 2020-02-02 ENCOUNTER — Encounter: Payer: Self-pay | Admitting: Infectious Diseases

## 2020-02-16 ENCOUNTER — Telehealth: Payer: Self-pay

## 2020-02-16 NOTE — Telephone Encounter (Signed)
COVID-19 Pre-Screening Questions:02/16/20  Do you currently have a fever (>100 F), chills or unexplained body aches?NO   Are you currently experiencing new cough, shortness of breath, sore throat, runny nose? NO  .  Have you recently travelled outside the state of [place] in the last 14 days?NO .  Have you been in contact with someone that is currently pending confirmation of Covid19 testing or has been confirmed to have the Covid19 virus?  NO   **If the patient answers NO to ALL questions -  advise the patient to please call the clinic before coming to the office should any symptoms develop.     

## 2020-02-17 ENCOUNTER — Other Ambulatory Visit: Payer: Self-pay

## 2020-02-17 ENCOUNTER — Ambulatory Visit (INDEPENDENT_AMBULATORY_CARE_PROVIDER_SITE_OTHER): Payer: Medicare (Managed Care) | Admitting: Infectious Diseases

## 2020-02-17 ENCOUNTER — Encounter: Payer: Self-pay | Admitting: Infectious Diseases

## 2020-02-17 DIAGNOSIS — B20 Human immunodeficiency virus [HIV] disease: Secondary | ICD-10-CM

## 2020-02-17 DIAGNOSIS — Z0000 Encounter for general adult medical examination without abnormal findings: Secondary | ICD-10-CM | POA: Diagnosis not present

## 2020-02-17 NOTE — Assessment & Plan Note (Signed)
Maintained through [facility] - vaccines updated with [facility] records.

## 2020-02-17 NOTE — Patient Instructions (Addendum)
  For your vitamins - please separate from Tivicay and Odefsey 2 hours before or 4 hours after to avoid any interaction. You are doing great!  No changes to your medication - could consider consolidating to a medication called JULUCA once a day if you are interested. It has 2 of the 4 medications you are currently taking now. The benefit to consider switching would be to decrease your exposure to other medications and one pill as opposed to 2.   Please have Santina Evans draw the additional labs as ordered on the prescription.    Please return in 1 year or sooner if you need [place].

## 2020-02-17 NOTE — Progress Notes (Signed)
Name: KEYONDRA LAGRAND DOB: Jun 25, 1961 MRN: [id number] PCP: Izetta Dakin, NP ([facility]) Cardiologist: Bryan Lemma, MD  Patient Active Problem List   Diagnosis Date Noted  . Intermittent palpitations - with pauses 02/05/2018  . PVC's (premature ventricular contractions) 02/05/2018  . Daytime somnolence 02/05/2018  . Sensation of chest tightness 02/05/2018  . Leg cramps 07/28/2017  . Healthcare maintenance 07/28/2017  . Essential hypertension 05/19/2014  . Morbid obesity with BMI of 40.0-44.9, adult (HCC) 07/21/2012  . Human immunodeficiency virus (HIV) disease (HCC) 10/03/2011    Subjective:  Brief Narrative: KAMAILE ZACHOW is a 59 y.o. AA female with HIV infection. Originally dx in 1998 with CD4 nadir of 10.  HIV Risk: multiple blood transfusions in the past. OI Hx: unknown. Previously in care at [facility] with Dr. Manfred Arch. 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   Previous Regimens:   Combivir + Efavirenz --> viral load increasingly detectable (~3000copies)  2018 - Odefsey + Tivicay --> undetectable on regimen 02/2017  Genotype:   11/13/2016 - K103N and M184V mutation (R-efavirenze, nevirapine; R-lamivudine, emtricitabine)  Chief Complaint  Patient presents with  . Follow-up    1 year      HPI/ROS:  Dreanna has had some depression lately with challenges over her mother's care in the nursing home. She used mindset work / medications / self help tapes to help with reducing her depression and stress and has been doing much better. She also lost her sister after a chronic illness.   She has no changes to her health history today. Medication list updated - she continues to take her Tivicay + Odefsey everyday.   Had some trouble with low iron counts when she stopped eating her liver regularly and felt tired/fatigued. Stores were low - offered iron transfusion but she resumed her diet supplements  and doing better now. Had labs drawn a few weeks ago. Received her 2nd Moderna COVID vaccine yesterday - doing well just a bit of a sore arm.   She gets preventative care [facility] (sees Izetta Dakin, NP). Pap smear normal back in 01-2019.    Review of Systems  Constitutional: Positive for malaise/fatigue (recently low iron stores - better now). Negative for chills, fever and weight loss.  HENT: Negative for hearing loss and sore throat.   Eyes: Negative.   Respiratory: Negative for cough and sputum production.   Cardiovascular: Positive for leg swelling (occasionally ). Negative for chest pain and palpitations.  Gastrointestinal: Negative for abdominal pain, diarrhea and vomiting.  Genitourinary: Negative for dysuria.  Musculoskeletal: Positive for joint pain (knees and ankles). Negative for myalgias and neck pain.       Uses walker at home   Skin: Negative for rash.  Neurological: Negative for dizziness and headaches.  Psychiatric/Behavioral: Negative for depression (ups and downs. ) and substance abuse. The patient is not nervous/anxious and does not have insomnia.     Past Medical History:  Diagnosis Date  . Allergy   . Anemia   . Arthritis   . Asthma   . Blood transfusion without reported diagnosis   . Chronic pain   . Depression   . Fibromyalgia   . Fracture of right femur (HCC)    Shatter bone - metal rod from right hip to right knee with 3 screws in rt knee and 1 in the rt hip.  02/12/2017  . GAD (generalized anxiety disorder)   .  GERD (gastroesophageal reflux disease)   . HIV (human immunodeficiency virus infection) ([place]) 1998  . Hyperlipidemia   . Hypertension   . Insomnia   . Obesity, morbid, BMI 40.0-49.9 ([employer])   . Osteoporosis   . Spinal stenosis    Essentially wheelchair-bound   Outpatient Medications Prior to Visit  Medication Sig Dispense Refill  . acetaminophen (TYLENOL) 500 MG tablet Take 1,000 mg by mouth.    Marland Kitchen albuterol (PROVENTIL  HFA;VENTOLIN HFA) 108 (90 Base) MCG/ACT inhaler Inhale 2 puffs into the lungs every 4 (four) hours as needed for wheezing or shortness of breath.    Marland Kitchen aluminum-magnesium hydroxide-simethicone (MAALOX) 509-326-71 MG/5ML SUSP Take 30 mLs by mouth.    Marland Kitchen aspirin EC 81 MG tablet Take 81 mg by mouth daily.    . diphenhydrAMINE (BENADRYL) 25 MG tablet Take 25 mg by mouth every 8 (eight) hours as needed.    . dolutegravir (TIVICAY) 50 MG tablet Take 50 mg by mouth daily.    . DULoxetine (CYMBALTA) 30 MG capsule Take 30 mg by mouth daily.    Marland Kitchen emtricitabine-rilpivir-tenofovir AF (ODEFSEY) 200-25-25 MG TABS tablet Take 1 tablet by mouth daily with breakfast.    . EPINEPHrine (EPIPEN 2-PAK) 0.3 mg/0.3 mL IJ SOAJ injection Inject into the muscle once.    . hydrochlorothiazide (MICROZIDE) 12.5 MG capsule Take 12.5 mg by mouth daily.    Marland Kitchen ibuprofen (ADVIL,MOTRIN) 200 MG tablet Take 200 mg by mouth every 6 (six) hours as needed.    Marland Kitchen lisinopril (PRINIVIL,ZESTRIL) 10 MG tablet Take 10 mg by mouth daily.    Marland Kitchen loperamide (IMODIUM A-D) 2 MG tablet Take 2 mg by mouth.    . loratadine (CLARITIN) 10 MG tablet Take 10 mg by mouth daily.    . Magnesium (V-R MAGNESIUM) 250 MG TABS Take by mouth.    . montelukast (SINGULAIR) 10 MG tablet Take by mouth.    . OLANZapine (ZYPREXA) 2.5 MG tablet Take 2.5 mg by mouth at bedtime.    . ondansetron (ZOFRAN-ODT) 4 MG disintegrating tablet Take 4 mg by mouth every 8 (eight) hours as needed for nausea or vomiting.    . polyethylene glycol (MIRALAX / GLYCOLAX) packet Take 1 packet as needed by mouth.    . pregabalin (LYRICA) 75 MG capsule Take 75 mg by mouth 2 (two) times daily.    Marland Kitchen Spirulina 500 MG TABS Take 1 tablet daily by mouth.    . traZODone (DESYREL) 50 MG tablet Take 50 mg by mouth at bedtime.    . valACYclovir (VALTREX) 1000 MG tablet Take 1 tablet (1,000 mg total) by mouth 2 (two) times daily. For a week 14 tablet 6  . vitamin C (ASCORBIC ACID) 500 MG tablet Take 500 mg  by mouth daily.     No facility-administered medications prior to visit.   Social History   Tobacco Use  . Smoking status: Former Smoker    Types: Cigarettes    Quit date: 2010    Years since quitting: 11.1  . Smokeless tobacco: Never Used  Substance Use Topics  . Alcohol use: Yes    Comment: occ wine with dinner  . Drug use: No    Family History  Problem Relation Age of Onset  . Alzheimer's disease Mother   . CAD Mother        CABG x4  . Cirrhosis Father        Died at age 7  . CAD Maternal Grandmother  History of CABG  . Stroke Maternal Grandfather        4 total  . Heart disease Maternal Grandfather        Unsure of details  . Healthy Brother   . Colon cancer Neg Hx   . Esophageal cancer Neg Hx   . Rectal cancer Neg Hx   . Stomach cancer Neg Hx     Allergies  Allergen Reactions  . Iodinated Diagnostic Agents Anaphylaxis  . Iodine Anaphylaxis  . Other Anaphylaxis  . Shellfish Allergy Rash and Anaphylaxis    SHRIMP-Rash] [Shock] [Resp]  . Shellfish-Derived Products Rash    Other reaction(s): Respiratory Distress (ALLERGY/intolerance), Shock (ALLERGY) SHRIMP-Rash] [Shock] [Resp]  . Diflunisal Other (See Comments) and Swelling    Other reaction(s): Facial Edema (intolerance)  . Penicillins Other (See Comments)    Other] headache  . Sulfamethoxazole Rash  . Voltaren  [Diclofenac Sodium] Swelling    Unilateral leg swelling Unilateral leg swelling  . Diclofenac   . Lactose Intolerance (Gi)     Objective:   Vitals:   02/17/20 1002  BP: 132/85  Pulse: 82  Temp: (!) 97.5 F (36.4 C)  Weight: (!) 317 lb (143.8 kg)  Height: 5' 5.5" (1.664 m)   Body mass index is 51.95 kg/m.  Physical Exam  Constitutional: She is oriented to person, place, and time and well-developed, well-nourished, and in no distress.  Very pleasant woman seated in wheelchair.   HENT:  Mouth/Throat: Oropharynx is clear and moist. No oral lesions. No dental abscesses.   Eyes: Pupils are equal, round, and reactive to light. No scleral icterus.  Cardiovascular: Normal rate, regular rhythm, normal heart sounds and intact distal pulses.  No murmur heard. Pulmonary/Chest: Effort normal and breath sounds normal. No respiratory distress.  Abdominal: Soft. Bowel sounds are normal. She exhibits no distension. There is no abdominal tenderness.  Musculoskeletal:        General: No tenderness. Normal range of motion.     Cervical back: Neck supple.  Lymphadenopathy:    She has no cervical adenopathy.  Neurological: She is alert and oriented to person, place, and time.  Skin: Skin is warm and dry. No rash noted.  Psychiatric: Mood, affect and judgment normal.  Vitals reviewed.   Lab Results       HIV 1 RNA Quant (copies/mL)  Date Value  08/24/2018 48 (H)  02/10/2018 28 (H)   CD4 T Cell Abs (/uL)  Date Value  08/24/2018 310 (L)  02/10/2018 400  07/28/2017 470     Assessment & Plan:   Problem List Items Addressed This Visit      Unprioritized   Human immunodeficiency virus (HIV) disease (HCC) (Chronic)    She has M184V and K103N mutations - could consolidate her therapy to Juluca alone to avoid unnecessary drug exposure but she would like to continue her current regimen at this time which is also very reasonable as it is working well for her.  Please continue Tivicay + Odefsey.  Please pay particular attention to scheduling her supplements 2 hours before or 4 hours after to avoid decreasing absorption.  Please have [facility] draw HIV RNA Quant and CD4 Count  RTC in 1 year.       Healthcare maintenance    Maintained through [facility] - vaccines updated with [facility] records.         Rexene Alberts, MSN, NP-C [facility]   02/17/20

## 2020-02-17 NOTE — Assessment & Plan Note (Signed)
She has M184V and K103N mutations - could consolidate her therapy to Juluca alone to avoid unnecessary drug exposure but she would like to continue her current regimen at this time which is also very reasonable as it is working well for her.  Please continue Tivicay + Odefsey.  Please pay particular attention to scheduling her supplements 2 hours before or 4 hours after to avoid decreasing absorption.  Please have [facility] draw HIV RNA Quant and CD4 Count  RTC in 1 year.

## 2020-02-23 ENCOUNTER — Encounter: Payer: Self-pay | Admitting: Infectious Diseases

## 2020-03-07 ENCOUNTER — Other Ambulatory Visit: Payer: Medicare (Managed Care)

## 2020-05-11 ENCOUNTER — Other Ambulatory Visit: Payer: Medicare (Managed Care)

## 2020-06-16 ENCOUNTER — Other Ambulatory Visit: Payer: Self-pay | Admitting: Family Medicine

## 2020-06-16 ENCOUNTER — Other Ambulatory Visit: Payer: Self-pay | Admitting: Gerontology

## 2020-06-16 DIAGNOSIS — Z1231 Encounter for screening mammogram for malignant neoplasm of breast: Secondary | ICD-10-CM

## 2020-07-20 ENCOUNTER — Other Ambulatory Visit: Payer: Self-pay | Admitting: Family Medicine

## 2020-07-20 DIAGNOSIS — E2839 Other primary ovarian failure: Secondary | ICD-10-CM

## 2020-07-25 ENCOUNTER — Ambulatory Visit
Admission: RE | Admit: 2020-07-25 | Discharge: 2020-07-25 | Disposition: A | Discharge status: Home / Self Care | Payer: Medicare (Managed Care) | Source: Ambulatory Visit | Attending: Family Medicine | Admitting: Family Medicine

## 2020-07-25 ENCOUNTER — Other Ambulatory Visit: Payer: Self-pay

## 2020-07-25 DIAGNOSIS — E2839 Other primary ovarian failure: Secondary | ICD-10-CM

## 2020-10-04 ENCOUNTER — Other Ambulatory Visit: Payer: Self-pay

## 2020-10-04 ENCOUNTER — Ambulatory Visit
Admission: RE | Admit: 2020-10-04 | Discharge: 2020-10-04 | Disposition: A | Discharge status: Home / Self Care | Payer: Medicare (Managed Care) | Source: Ambulatory Visit | Attending: Gerontology | Admitting: Gerontology

## 2020-10-04 DIAGNOSIS — Z1231 Encounter for screening mammogram for malignant neoplasm of breast: Secondary | ICD-10-CM

## 2021-02-08 ENCOUNTER — Telehealth: Payer: Self-pay

## 2021-02-08 NOTE — Telephone Encounter (Signed)
I connected by phone with Cy Blamer and/or patient's caregiver on 02/08/2021 at 4:16 PM to discuss the potential vaccination through our Homebound vaccination initiative.   Prevaccination Checklist for COVID-19 Vaccines  1.  Are you feeling sick today? no  2.  Have you ever received a dose of a COVID-19 vaccine?  yes      If yes, which one? Moderna   How many dose of Covid-19 vaccine have your received and dates ?2, 01/19/2020, 02/16/2020   Check all that apply: I live in a long-term care setting. no  I have been diagnosed with a medical condition(s). Please list: Fractured femur with rod, in wheelchair (pertinent to homebound status)  I am a first responder. no  I work in a long-term care facility, correctional facility, hospital, restaurant, retail setting, school, or other setting with high exposure to the public. no  4. Do you have a health condition or are you undergoing treatment that makes you moderately or severely immunocompromised? (This would include treatment for cancer or HIV, receipt of organ transplant, immunosuppressive therapy or high-dose corticosteroids, CAR-T-cell therapy, hematopoietic cell transplant [HCT], DiGeorge syndrome or Wiskott-Aldrich syndrome)  no  5. Have you received hematopoietic cell transplant (HCT) or CAR-T-cell therapies since receiving COVID-19 vaccine? no  6.  Have you ever had an allergic reaction: (This would include a severe reaction [ e.g., anaphylaxis] that required treatment with epinephrine or EpiPen or that caused you to go to the hospital.  It would also include an allergic reaction that occurred within 4 hours that caused hives, swelling, or respiratory distress, including wheezing.) A.  A previous dose of COVID-19 vaccine. yes B.  A vaccine or injectable therapy that contains multiple components, one of which is a COVID-19 vaccine component, but it is not known which component elicited the immediate reaction. no  C.  Are you allergic to  polyethylene glycol? no  D. Are you allergic to Polysorbate, which is found in some vaccines, film coated tablets and intravenous steroids?  no   7.  Have you ever had an allergic reaction to another vaccine (other than COVID-19 vaccine) or an injectable medication? (This would include a severe reaction [ e.g., anaphylaxis] that required treatment with epinephrine or EpiPen or that caused you to go to the hospital.  It would also include an allergic reaction that occurred within 4 hours that caused hives, swelling, or respiratory distress, including wheezing.)  no   8.  Have you ever had a severe allergic reaction (e.g., anaphylaxis) to something other than a component of the COVID-19 vaccine, or any vaccine or injectable medication?  This would include food, pet, venom, environmental, or oral medication allergies.  yes shellfish  Check all that apply to you:  Am a female between ages 27 and 45 years old  no  Women 12 through 61 years of age can receive any FDA-authorized or -approved COVID-19 vaccine. However, they should be informed of the rare but increased risk of thrombosis with thrombocytopenia syndrome (TTS) after receipt of the [employer] Vaccine and the availability of other FDA-authorized and -approved COVID-19 vaccines. People who had TTS after a first dose of Janssen vaccine should not receive a subsequent dose of Janssen product    Am a female between ages 57 and 84 years old  no Males 5 through 62 years of age may receive the correct formulation of Pfizer-BioNTech COVID-19 vaccine. Males 18 and older can receive any FDA-authorized or -approved vaccine. However, people receiving an mRNA COVID-19  vaccine, especially males 12 through 62 years of age and their parents/legal representative (when relevant), should be informed of the risk of developing myocarditis (an inflammation of the heart muscle) or pericarditis (inflammation of the lining around the heart) after receipt of an mRNA  vaccine. The risk of developing either myocarditis or pericarditis after vaccination is low, and lower than the risk of myocarditis associated with SARS-CoV-2 infection in adolescents and adults. Vaccine recipients should be counseled about the need to seek care if symptoms of myocarditis or pericarditis develop after vaccination     Have a history of myocarditis or pericarditis  no Myocarditis or pericarditis after receipt of the first dose of an mRNA COVID-19 vaccine series but before administration of the second dose  Experts advise that people who develop myocarditis or pericarditis after a dose of an mRNA COVID-19 vaccine not receive a subsequent dose of any COVID-19 vaccine, until additional safety data are available.  Administration of a subsequent dose of COVID-19 vaccine before safety data are available can be considered in certain circumstances after the episode of myocarditis or pericarditis has completely resolved. Until additional data are available, some experts recommend a Linwood Dibbles COVID-19 vaccine be considered instead of an mRNA COVID-19 vaccine. Decisions about proceeding with a subsequent dose should include a conversation between the patient, their parent/legal representative (when relevant), and their clinical team, which may include a cardiologist.    Have been treated with monoclonal antibodies or convalescent serum to prevent or treat COVID-19  no Vaccination should be offered to people regardless of history of prior symptomatic or asymptomatic SARS-CoV-2 infection. There is no recommended minimal interval between infection and vaccination.  However, vaccination should be deferred if a patient received monoclonal antibodies or convalescent serum as treatment for COVID-19 or for post-exposure prophylaxis. This is a precautionary measure until additional information becomes available, to avoid interference of the antibody treatment with vaccine-induced immune responses.  Defer  COVID-19 vaccination for 30 days when a passive antibody product was used for post-exposure prophylaxis.  Defer COVID-19 vaccination for 90 days when a passive antibody product was used to treat COVID-19.     Diagnosed with Multisystem Inflammatory Syndrome (MIS-C or MIS-A) after a COVID-19 infection  no It is unknown if people with a history of MIS-C or MIS-A are at risk for a dysregulated immune response to COVID-19 vaccination.  People with a history of MIS-C or MIS-A may choose to be vaccinated. Considerations for vaccination may include:   Clinical recovery from MIS-C or MIS-A, including return to normal cardiac function   Personal risk of severe acute COVID-19 (e.g., age, underlying conditions)   High or substantial community transmission of SARS-CoV-2 and personal increased risk of reinfection.   Timing of any immunomodulatory therapies (general best practice guidelines for immunization can be consulted for more information [employer])   It has been 90 days or more since their diagnosis of MIS-C   Onset of MIS-C occurred before any COVID-19 vaccination   A conversation between the patient, their guardian(s), and their clinical team or a specialist may assist with COVID-19 vaccination decisions. Healthcare providers and health departments may also request a consultation from the Clinical Immunization Safety Assessment Project at [employer] vaccinesafety/ensuringsafety/monitoring/cisa/index.html.     Have a bleeding disorder  no Take a blood thinner  no As with all vaccines, any COVID-19 vaccine product may be given to these patients, if a physician familiar with the patient's bleeding risk determines that the vaccine can be administered intramuscularly with reasonable safety.  ACIP recommends the following technique for intramuscular vaccination in patients with bleeding disorders or taking blood thinners: a fine-gauge needle  (23-gauge or smaller caliber) should be used for the vaccination, followed by firm pressure on the site, without rubbing, for at least 2 minutes.  People who regularly take aspirin or anticoagulants as part of their routine medications do not need to stop these medications prior to receipt of any COVID-19 vaccine.    Have a history of heparin-induced thrombocytopenia (HIT)  no Although the etiology of TTS associated with the Alphonsa Overall COVID-19 vaccine is unclear, it appears to be similar to another rare immune-mediated syndrome, heparin-induced thrombocytopenia (HIT). People with a history of an episode of an immune-mediated syndrome characterized by thrombosis and thrombocytopenia, such as HIT, should be offered a currently FDA-approved or FDA-authorized mRNA COVID-19 vaccine if it has been ?90 days since their TTS resolved. After 90 days, patients may be vaccinated with any currently FDA-approved or FDA-authorized COVID-19 vaccine, including Janssen COVID-19 Vaccine. However, people who developed TTS after their initial Alphonsa Overall vaccine should not receive a Janssen booster dose.  Experts believe the following factors do not make people more susceptible to TTS after receipt of the [employer]. People with these conditions can be vaccinated with any FDA-authorized or - approved COVID-19 vaccine, including the [employer] COVID-19 Vaccine:   A prior history of venous thromboembolism   Risk factors for venous thromboembolism (e.g., inherited or acquired thrombophilia including Factor V Leiden; prothrombin gene 20210A mutation; antiphospholipid syndrome; protein C, protein S or antithrombin deficiency   A prior history of other types of thromboses not associated with thrombocytopenia   Pregnancy, post-partum status, or receipt of hormonal contraceptives (e.g., combined oral contraceptives, patch, ring)   Additional recipient education materials can be found at http://gutierrez-robinson.com/  vaccines/safety/JJUpdate.html.    Am currently pregnant or breastfeeding  no Vaccination is recommended for all people aged 68 years and older, including people that are:   Pregnant   Breastfeeding   Trying to get pregnant now or who might become pregnant in the future   Pregnant, breastfeeding, and post-partum people 31 through 62 years of age should be aware of the rare risk of TTS after receipt of the Alphonsa Overall COVID-19 Vaccine and the availability of other FDA-authorized or -approved COVID-19 vaccines (i.e., mRNA vaccines).    Have received dermal fillers  no FDA-authorized or -approved COVID-19 vaccines can be administered to people who have received injectable dermal fillers who have no contraindications for vaccination.  Infrequently, these people might experience temporary swelling at or near the site of filler injection (usually the face or lips) following administration of a dose of an mRNA COVID-19 vaccine. These people should be advised to contact their healthcare provider if swelling develops at or near the site of dermal filler following vaccination.     Have a history of Guillain-Barr Syndrome (GBS)  no People with a history of GBS can receive any FDA-authorized or -approved COVID-19 vaccine. However, given the possible association between the [employer] and an increased risk of GBS, a patient with a history of GBS and their clinical team should discuss the availability of mRNA vaccines to offer protection against COVID-19. The highest risk has been observed in men aged 21-64 years with symptoms of GBS beginning within 42 days after Alphonsa Overall COVID-19 vaccination.  People who had GBS after receiving Janssen vaccine should be made aware of the option to receive an mRNA COVID-19 vaccine booster at least 2 months (8 weeks) after  the Janssen dose. However, Linwood Dibbles vaccine may be used as a booster, particularly if GBS occurred more than 42 days after vaccination or was related  to a non-vaccine factor. Prior to booster vaccination, a conversation between the patient and their clinical team may assist with decisions about use of a COVID-19 booster dose, including the timing of administration     Postvaccination Observation Times for People without Contraindications to Covid 19 Vaccination.  30 minutes:  People with a history of: A contraindication to another type of COVID-19 vaccine product (i.e., mRNA or viral vector COVID-19 vaccines)   Immediate (within 4 hours of exposure) non-severe allergic reaction to a COVID-19 vaccine or injectable therapies   Anaphylaxis due to any cause   Immediate allergic reaction of any severity to a non-COVID-19 vaccine   15 minutes: All other people  This patient is a 60 y.o. female that meets the FDA criteria to receive homebound vaccination. Patient or parent/caregiver understands they have the option to accept or refuse homebound vaccination.  Patient passed the pre-screening checklist and would like to proceed with homebound vaccination.  Based on questionnaire above, I recommend the patient be observed for 15 minutes.  There are an estimated #1 other household members/caregivers who are also interested in receiving the vaccine.    The patient has been confirmed homebound and eligible for homebound vaccination with the considerations outlined above. I will send the patient's information to our scheduling team who will reach out to schedule the patient and potential caregiver/family members for homebound vaccination.    Skip Mayer 02/08/2021 4:16 PM

## 2021-02-13 ENCOUNTER — Ambulatory Visit: Payer: Medicare (Managed Care) | Attending: Critical Care Medicine

## 2021-02-13 DIAGNOSIS — Z23 Encounter for immunization: Secondary | ICD-10-CM

## 2021-02-13 NOTE — Progress Notes (Signed)
   Covid-19 Vaccination Clinic  Name:  Katherine Howell    MRN: [id number] DOB: 10-Dec-1960  02/13/2021  [name] was observed post Covid-19 immunization for 15 min without incident. She was provided with Vaccine Information Sheet and instruction to access the V-Safe system.   [name] was instructed to call 911 with any severe reactions post vaccine: Marland Kitchen Difficulty breathing  . Swelling of face and throat  . A fast heartbeat  . A bad rash all over body  . Dizziness and weakness   Immunizations Administered    Name Date Dose VIS Date Route   Moderna Covid-19 Booster Vaccine 02/13/2021 10:30 AM 0.25 mL 09/27/2020 Intramuscular   Manufacturer: Moderna   Lot: [id number]   NDC: 13086-578-46

## 2021-02-15 ENCOUNTER — Ambulatory Visit: Payer: Medicare (Managed Care) | Admitting: Infectious Diseases

## 2021-02-21 ENCOUNTER — Other Ambulatory Visit: Payer: Self-pay

## 2021-02-21 ENCOUNTER — Ambulatory Visit (INDEPENDENT_AMBULATORY_CARE_PROVIDER_SITE_OTHER): Payer: Medicare (Managed Care) | Admitting: Infectious Diseases

## 2021-02-21 ENCOUNTER — Encounter: Payer: Self-pay | Admitting: Infectious Diseases

## 2021-02-21 DIAGNOSIS — I10 Essential (primary) hypertension: Secondary | ICD-10-CM

## 2021-02-21 DIAGNOSIS — B20 Human immunodeficiency virus [HIV] disease: Secondary | ICD-10-CM | POA: Diagnosis not present

## 2021-02-21 NOTE — Progress Notes (Signed)
Name: Katherine Howell DOB: 10-18-1961 MRN: [id number] PCP: Izetta Dakin, NP ([facility]) Cardiologist: Bryan Lemma, MD   Subjective:  Brief Narrative: Katherine Howell is a 60 y.o. AA female with HIV infection. Originally dx in 1998 with CD4 nadir of 10.  HIV Risk: multiple blood transfusions in the past. OI Hx: unknown. Previously in care at [facility] with Dr. Manfred Arch. 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   Previous Regimens:   Combivir + Efavirenz --> viral load increasingly detectable (~3000copies)  2018 - Odefsey + Tivicay --> undetectable on regimen 02/2017  Genotype:   11/13/2016 - K103N and M184V mutation (R-efavirenze, nevirapine; R-lamivudine, emtricitabine)    CC:   HIV routine follow up care. Knee pain.    HPI/ROS:  She stopped quite a few of her medications recently that she felt was unnecessary - mostly vitamins / supplements but also her BP medications. She says her blood pressure is high today because of her knee pain. She has no changes to her health history today. Started increasing her walking and lower extremity exercise. Medication list updated - she continues to take her Tivicay + Odefsey everyday.   Got her COVID booster 02/13/2021.   She is trying to take care of herself as best as she can so she can be around for her mother and son.   She gets preventative care from [facility] (sees Izetta Dakin, NP). Pap smear normal back in 01-2019.    Review of Systems  Constitutional: Negative for chills, fever and malaise/fatigue.  HENT: Negative for tinnitus.   Eyes: Negative for blurred vision and photophobia.  Respiratory: Negative for cough and sputum production.   Cardiovascular: Positive for leg swelling. Negative for chest pain.  Gastrointestinal: Negative for diarrhea, nausea and vomiting.  Genitourinary: Negative for dysuria.  Musculoskeletal: Positive for joint  pain.  Skin: Negative for rash.  Neurological: Negative for headaches.    Past Medical History:  Diagnosis Date  . Allergy   . Anemia   . Arthritis   . Asthma   . Blood transfusion without reported diagnosis   . Chronic pain   . Depression   . Fibromyalgia   . Fracture of right femur (HCC)    Shatter bone - metal rod from right hip to right knee with 3 screws in rt knee and 1 in the rt hip.  02/12/2017  . GAD (generalized anxiety disorder)   . GERD (gastroesophageal reflux disease)   . HIV (human immunodeficiency virus infection) (HCC) 1998  . Hyperlipidemia   . Hypertension   . Insomnia   . Obesity, morbid, BMI 40.0-49.9 (HCC)   . Osteoporosis   . Spinal stenosis    Essentially wheelchair-bound   Outpatient Medications Prior to Visit  Medication Sig Dispense Refill  . acetaminophen (TYLENOL) 500 MG tablet Take 1,000 mg by mouth.    Marland Kitchen albuterol (PROVENTIL HFA;VENTOLIN HFA) 108 (90 Base) MCG/ACT inhaler Inhale 2 puffs into the lungs every 4 (four) hours as needed for wheezing or shortness of breath.    Marland Kitchen aluminum-magnesium hydroxide-simethicone (MAALOX) 200-200-20 MG/5ML SUSP Take 30 mLs by mouth.    . diphenhydrAMINE (BENADRYL) 25 MG tablet Take 25 mg by mouth every 8 (eight) hours as needed.    . dolutegravir (TIVICAY) 50 MG tablet Take 50 mg by mouth daily.    . DULoxetine (CYMBALTA) 30 MG capsule Take 30 mg by mouth daily.    Marland Kitchen  emtricitabine-rilpivir-tenofovir AF (ODEFSEY) 200-25-25 MG TABS tablet Take 1 tablet by mouth daily with breakfast.    . EPINEPHrine 0.3 mg/0.3 mL IJ SOAJ injection Inject into the muscle once.    Marland Kitchen ibuprofen (ADVIL,MOTRIN) 200 MG tablet Take 200 mg by mouth every 6 (six) hours as needed.    . loperamide (IMODIUM A-D) 2 MG tablet Take 2 mg by mouth.    . loratadine (CLARITIN) 10 MG tablet Take 10 mg by mouth daily.    . ondansetron (ZOFRAN-ODT) 4 MG disintegrating tablet Take 4 mg by mouth every 8 (eight) hours as needed for nausea or vomiting.    .  polyethylene glycol (MIRALAX / GLYCOLAX) packet Take 1 packet as needed by mouth.    . vitamin C (ASCORBIC ACID) 500 MG tablet Take 500 mg by mouth daily.    Marland Kitchen aspirin EC 81 MG tablet Take 81 mg by mouth daily. (Patient not taking: Reported on 02/21/2021)    . hydrochlorothiazide (MICROZIDE) 12.5 MG capsule Take 12.5 mg by mouth daily. (Patient not taking: Reported on 02/21/2021)    . lisinopril (PRINIVIL,ZESTRIL) 10 MG tablet Take 10 mg by mouth daily. (Patient not taking: Reported on 02/21/2021)    . Magnesium 250 MG TABS Take by mouth. (Patient not taking: Reported on 02/21/2021)    . montelukast (SINGULAIR) 10 MG tablet Take by mouth. (Patient not taking: Reported on 02/21/2021)    . OLANZapine (ZYPREXA) 2.5 MG tablet Take 2.5 mg by mouth at bedtime. (Patient not taking: Reported on 02/21/2021)    . pregabalin (LYRICA) 75 MG capsule Take 75 mg by mouth 2 (two) times daily. (Patient not taking: Reported on 02/21/2021)    . Spirulina 500 MG TABS Take 1 tablet daily by mouth. (Patient not taking: Reported on 02/21/2021)    . traZODone (DESYREL) 50 MG tablet Take 50 mg by mouth at bedtime. (Patient not taking: Reported on 02/21/2021)    . valACYclovir (VALTREX) 1000 MG tablet Take 1 tablet (1,000 mg total) by mouth 2 (two) times daily. For a week (Patient not taking: Reported on 02/21/2021) 14 tablet 6   No facility-administered medications prior to visit.   Social History   Tobacco Use  . Smoking status: Former Smoker    Types: Cigarettes    Quit date: 2010    Years since quitting: 12.2  . Smokeless tobacco: Never Used  Vaping Use  . Vaping Use: Never used  Substance Use Topics  . Alcohol use: Yes    Comment: occ wine with dinner  . Drug use: No    Family History  Problem Relation Age of Onset  . Alzheimer's disease Mother   . CAD Mother        CABG x4  . Cirrhosis Father        Died at age 54  . CAD Maternal Grandmother        History of CABG  . Stroke Maternal Grandfather        4  total  . Heart disease Maternal Grandfather        Unsure of details  . Healthy Brother   . Colon cancer Neg Hx   . Esophageal cancer Neg Hx   . Rectal cancer Neg Hx   . Stomach cancer Neg Hx     Allergies  Allergen Reactions  . Iodinated Diagnostic Agents Anaphylaxis  . Iodine Anaphylaxis  . Other Anaphylaxis  . Shellfish Allergy Rash and Anaphylaxis    SHRIMP-Rash] [Shock] [Resp]  . Shellfish-Derived Products Rash  Other reaction(s): Respiratory Distress (ALLERGY/intolerance), Shock (ALLERGY) SHRIMP-Rash] [Shock] [Resp]  . Diflunisal Other (See Comments) and Swelling    Other reaction(s): Facial Edema (intolerance)  . Penicillins Other (See Comments)    Other] headache  . Sulfamethoxazole Rash  . Voltaren  [Diclofenac Sodium] Swelling    Unilateral leg swelling Unilateral leg swelling  . Diclofenac   . Lactose Intolerance (Gi)     Objective:   Vitals:   02/21/21 1440  BP: (!) 180/75  Pulse: 76  Temp: (!) 97.5 F (36.4 C)  TempSrc: Oral   There is no height or weight on file to calculate BMI.  Physical Exam Vitals reviewed.  Constitutional:      Comments: Very pleasant woman seated in wheelchair.   HENT:     Mouth/Throat:     Mouth: No oral lesions.     Dentition: No dental abscesses.  Eyes:     General: No scleral icterus.    Pupils: Pupils are equal, round, and reactive to light.  Cardiovascular:     Rate and Rhythm: Normal rate and regular rhythm.     Heart sounds: Normal heart sounds. No murmur heard.   Pulmonary:     Effort: Pulmonary effort is normal. No respiratory distress.     Breath sounds: Normal breath sounds.  Abdominal:     General: Bowel sounds are normal. There is no distension.     Palpations: Abdomen is soft.     Tenderness: There is no abdominal tenderness.  Musculoskeletal:        General: No tenderness. Normal range of motion.     Cervical back: Neck supple.  Lymphadenopathy:     Cervical: No cervical adenopathy.  Skin:     General: Skin is warm and dry.     Findings: No rash.  Neurological:     Mental Status: She is alert and oriented to person, place, and time.  Psychiatric:        Mood and Affect: Mood and affect normal.        Judgment: Judgment normal.     Lab Results See external results in media tab  Assessment & Plan:   Problem List Items Addressed This Visit      Unprioritized   Human immunodeficiency virus (HIV) disease (HCC) (Chronic)    Labs from 12-20-20 indicate VL < 40, CD4 403, SCr 0.71, LFTs normal ranges. Cholesterol 249, TG 93, HDL 59, LDL 174. She is not interested in adding any lipid lowering therapy.  Based on genotype I don't think her current regimen is likely more than she needs; however she would prefer to keep it the same given she is doing well with it. Would consider Biktarvy alone if she desires switch to simpler regimen.  Vaccines up to date  Has good access to routine health care with [facility].  Can offer her dental visits here if that is a need for her.  Return in about 1 year (around 02/21/2022). with labs to be drawn at [facility] - she will make sure they are done and faxed.       Essential hypertension (Chronic)    BP Readings from Last 3 Encounters:  02/21/21 (!) 180/75  02/17/20 132/85  02/17/19 118/83   Her blood pressure was under much better control in the past. States at home her SBP is <130. I encouraged her to continue checking it and consider adding back if her BP stays > 140/90 regularly.  Rexene Alberts, MSN, NP-C [facility]   02/21/21

## 2021-02-21 NOTE — Patient Instructions (Addendum)
Keep a close eye on your blood pressure - I would add that one back if you find that it is > 130/90 frequently.   Please continue your Tivicay and Charlett Lango once a day with food. Labs look beautiful!

## 2021-02-21 NOTE — Assessment & Plan Note (Signed)
BP Readings from Last 3 Encounters:  02/21/21 (!) 180/75  02/17/20 132/85  02/17/19 118/83   Her blood pressure was under much better control in the past. States at home her SBP is <130. I encouraged her to continue checking it and consider adding back if her BP stays > 140/90 regularly.

## 2021-02-21 NOTE — Assessment & Plan Note (Addendum)
Labs from 12-20-20 indicate VL < 40, CD4 403, SCr 0.71, LFTs normal ranges. Cholesterol 249, TG 93, HDL 59, LDL 174. She is not interested in adding any lipid lowering therapy.  Based on genotype I don't think her current regimen is likely more than she needs; however she would prefer to keep it the same given she is doing well with it. Would consider Biktarvy alone if she desires switch to simpler regimen.  Vaccines up to date  Has good access to routine health care with [facility].  Can offer her dental visits here if that is a need for her.  Return in about 1 year (around 02/21/2022). with labs to be drawn at [facility] - she will make sure they are done and faxed.

## 2021-06-26 ENCOUNTER — Other Ambulatory Visit: Payer: Self-pay | Admitting: Psychiatry

## 2021-06-26 ENCOUNTER — Other Ambulatory Visit: Payer: Self-pay | Admitting: Nurse Practitioner

## 2021-06-26 DIAGNOSIS — Z1231 Encounter for screening mammogram for malignant neoplasm of breast: Secondary | ICD-10-CM

## 2021-07-09 ENCOUNTER — Ambulatory Visit: Payer: Medicare (Managed Care) | Admitting: Family Medicine

## 2021-10-05 ENCOUNTER — Ambulatory Visit: Payer: Medicare (Managed Care)

## 2021-11-09 ENCOUNTER — Ambulatory Visit
Admission: RE | Admit: 2021-11-09 | Discharge: 2021-11-09 | Disposition: A | Discharge status: Home / Self Care | Payer: Medicare (Managed Care) | Source: Ambulatory Visit | Attending: Nurse Practitioner | Admitting: Nurse Practitioner

## 2021-11-09 DIAGNOSIS — Z1231 Encounter for screening mammogram for malignant neoplasm of breast: Secondary | ICD-10-CM

## 2022-10-30 ENCOUNTER — Telehealth: Payer: Self-pay

## 2022-10-30 NOTE — Telephone Encounter (Signed)
Received call from [facility] requesting lab work on patient for upcoming dental appointment. Patient has not been seen in the office since 02/2021.   Called [facility], she was unable to come in for an appointment due to recovering from surgery. Appears she had labs done at [facility] back in March of this year and was undetectable. Reports she has medication on hand and has not run out of her ART.   She will get labs done in December with [facility] and follow up with [facility] in January 2024. Asked her to please call and let us know if she needs refills.   Sandie Ano, RN

## 2022-12-16 ENCOUNTER — Telehealth: Payer: Self-pay

## 2022-12-16 ENCOUNTER — Ambulatory Visit: Payer: Medicare (Managed Care) | Admitting: Infectious Diseases

## 2022-12-16 NOTE — Telephone Encounter (Signed)
Called patient regarding missed appointment. Patient states that [facility] did not have appointment on their schedule and would not be able to transport her to visit.  Requested I call Janett Billow at [facility] and request to reschedule appointment.  Was able to connect with Janett Billow and move appointment to 1/16. Also requested labs from the past two months be faxed to office for review. Janett Billow verbalized understanding and will do this.  Leatrice Jewels, RMA

## 2022-12-20 ENCOUNTER — Encounter: Payer: Self-pay | Admitting: Internal Medicine

## 2022-12-20 DIAGNOSIS — Z1231 Encounter for screening mammogram for malignant neoplasm of breast: Secondary | ICD-10-CM

## 2022-12-24 ENCOUNTER — Ambulatory Visit (INDEPENDENT_AMBULATORY_CARE_PROVIDER_SITE_OTHER): Payer: Medicare (Managed Care) | Admitting: Infectious Diseases

## 2022-12-24 ENCOUNTER — Other Ambulatory Visit: Payer: Self-pay | Admitting: Internal Medicine

## 2022-12-24 ENCOUNTER — Encounter: Payer: Self-pay | Admitting: Infectious Diseases

## 2022-12-24 ENCOUNTER — Other Ambulatory Visit: Payer: Self-pay

## 2022-12-24 DIAGNOSIS — B20 Human immunodeficiency virus [HIV] disease: Secondary | ICD-10-CM

## 2022-12-24 DIAGNOSIS — Z1231 Encounter for screening mammogram for malignant neoplasm of breast: Secondary | ICD-10-CM

## 2022-12-24 MED ORDER — DOLUTEGRAVIR SODIUM 50 MG PO TABS: 50.0000 mg | ORAL_TABLET | Freq: Every day | ORAL | 11 refills | Status: AC

## 2022-12-24 MED ORDER — EMTRICITAB-RILPIVIR-TENOFOV AF 200-25-25 MG PO TABS: 1.0000 | ORAL_TABLET | Freq: Every day | ORAL | 11 refills | Status: AC

## 2022-12-24 NOTE — Patient Instructions (Addendum)
Please continue your tivicay and odefsey taken together daily with food like you have been.   Make sure you separate the liquid iron and other supplements at least 6 hours.    Would recommend you have your cervical screening updated in February this year - would recommend you continue screenings every 3 years.

## 2022-12-24 NOTE — Progress Notes (Signed)
Name: Katherine Howell DOB: 01/26/1961 MRN: [id number] PCP: Izetta Dakin, NP ([facility]) Cardiologist: Bryan Lemma, MD   Subjective:  Brief Narrative: Katherine Howell is a 62 y.o. AA female with HIV infection. Originally dx in 1998 with CD4 nadir of 10.  HIV Risk: multiple blood transfusions in the past. OI Hx: unknown. Previously in care at [facility] with Dr. Manfred Arch. 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   Previous Regimens:  Combivir + Efavirenz --> viral load increasingly detectable (~3000copies) 2018 - Odefsey + Tivicay --> undetectable on regimen 02/2017  Genotype:  11/13/2016 - K103N and M184V mutation (R-efavirenze, nevirapine; R-lamivudine, emtricitabine)    CC:   HIV routine follow up care.   HPI/ROS:    Had surgery on her left leg after feeling a snap - had a small break inside the knee replacement. Dr. Andrey Campanile at [facility] for longstem constrained total knee arthroplasty revision. Going back in May for right total knee replacement as well.   She gets preventative care from [facility] (sees Izetta Dakin, NP). Pap smear normal back in 01-2019.  Had RSV vaccination today with her lab draws.  Had flu shot already and appt to get COVID vaccine with her [facility].  Completed shingles vaccine last year   Weight got down to 289 lb but back up to 317 with surgery and holidays. Not as mobile yet as she wants to be.     Review of Systems  Constitutional:  Negative for chills, fever and malaise/fatigue.  HENT:  Negative for tinnitus.   Eyes:  Negative for blurred vision and photophobia.  Respiratory:  Negative for cough and sputum production.   Cardiovascular:  Negative for chest pain and leg swelling.  Gastrointestinal:  Negative for diarrhea, nausea and vomiting.  Genitourinary:  Negative for dysuria.  Musculoskeletal:  Positive for joint pain.  Skin:  Negative for rash.   Neurological:  Negative for headaches.    Past Medical History:  Diagnosis Date   Allergy    Anemia    Arthritis    Asthma    Blood transfusion without reported diagnosis    Chronic pain    Depression    Fibromyalgia    Fracture of right femur (HCC)    Shatter bone - metal rod from right hip to right knee with 3 screws in rt knee and 1 in the rt hip.  02/12/2017   GAD (generalized anxiety disorder)    GERD (gastroesophageal reflux disease)    HIV (human immunodeficiency virus infection) (HCC) 1998   Hyperlipidemia    Hypertension    Insomnia    Obesity, morbid, BMI 40.0-49.9 (HCC)    Osteoporosis    Spinal stenosis    Essentially wheelchair-bound    Outpatient Medications Prior to Visit  Medication Sig Dispense Refill   ALLOPURINOL PO Take by mouth.     aspirin EC 81 MG tablet Take 81 mg by mouth daily.     cyclobenzaprine (FLEXERIL) 5 MG tablet Take 5 mg by mouth 3 (three) times daily as needed for muscle spasms.     diphenhydrAMINE (BENADRYL) 25 MG tablet Take 25 mg by mouth every 8 (eight) hours as needed.     HYDROcodone-acetaminophen (NORCO/VICODIN) 5-325 MG tablet Take 1 tablet by mouth every 6 (six) hours as needed for moderate pain.     ibuprofen (ADVIL,MOTRIN) 200 MG tablet Take 200 mg by mouth every 6 (six)  hours as needed.     loperamide (IMODIUM A-D) 2 MG tablet Take 2 mg by mouth.     loratadine (CLARITIN) 10 MG tablet Take 10 mg by mouth daily.     Magnesium 250 MG TABS Take by mouth.     ondansetron (ZOFRAN-ODT) 4 MG disintegrating tablet Take 4 mg by mouth every 8 (eight) hours as needed for nausea or vomiting.     polyethylene glycol (MIRALAX / GLYCOLAX) packet Take 1 packet as needed by mouth.     rosuvastatin (CRESTOR) 5 MG tablet Take 1 tablet by mouth daily.     Spirulina 500 MG TABS Take 1 tablet by mouth daily.     acetaminophen (TYLENOL) 500 MG tablet Take 1,000 mg by mouth.     dolutegravir (TIVICAY) 50 MG tablet Take 50 mg by mouth daily.      emtricitabine-rilpivir-tenofovir AF (ODEFSEY) 200-25-25 MG TABS tablet Take 1 tablet by mouth daily with breakfast.     acetaminophen (TYLENOL) 325 MG tablet Take by mouth.     albuterol (PROVENTIL HFA;VENTOLIN HFA) 108 (90 Base) MCG/ACT inhaler Inhale 2 puffs into the lungs every 4 (four) hours as needed for wheezing or shortness of breath. (Patient not taking: Reported on 12/24/2022)     Cholecalciferol 125 MCG/ML LIQD Take by mouth.     Cyanocobalamin 5000 MCG/ML LIQD Place under the tongue.     EPINEPHrine 0.3 mg/0.3 mL IJ SOAJ injection Inject into the muscle once. (Patient not taking: Reported on 12/24/2022)     ferrous sulfate 220 (44 Fe) MG/5ML solution Take by mouth.     hydrOXYzine (ATARAX) 25 MG tablet Take 1 tablet by mouth 3 (three) times daily as needed.     tetrahydrozoline 0.05 % ophthalmic solution 1 drop 3 (three)  times daily as needed.     aluminum-magnesium hydroxide-simethicone (MAALOX) 200-200-20 MG/5ML SUSP Take 30 mLs by mouth. (Patient not taking: Reported on 12/24/2022)     DULoxetine (CYMBALTA) 30 MG capsule Take 30 mg by mouth daily. (Patient not taking: Reported on 12/24/2022)     hydrochlorothiazide (MICROZIDE) 12.5 MG capsule Take 12.5 mg by mouth daily. (Patient not taking: Reported on 02/21/2021)     lisinopril (PRINIVIL,ZESTRIL) 10 MG tablet Take 10 mg by mouth daily. (Patient not taking: Reported on 02/21/2021)     montelukast (SINGULAIR) 10 MG tablet Take by mouth. (Patient not taking: Reported on 02/21/2021)     OLANZapine (ZYPREXA) 2.5 MG tablet Take 2.5 mg by mouth at bedtime. (Patient not taking: Reported on 02/21/2021)     pregabalin (LYRICA) 75 MG capsule Take 75 mg by mouth 2 (two) times daily. (Patient not taking: Reported on 02/21/2021)     traZODone (DESYREL) 50 MG tablet Take 50 mg by mouth at bedtime. (Patient not taking: Reported on 02/21/2021)     vitamin C (ASCORBIC ACID) 500 MG tablet Take 500 mg by mouth daily. (Patient not taking: Reported on 12/24/2022)      No facility-administered medications prior to visit.   Social History   Tobacco Use   Smoking status: Former    Types: Cigarettes    Quit date: 2010    Years since quitting: 14.0   Smokeless tobacco: Never  Vaping Use   Vaping Use: Never used  Substance Use Topics   Alcohol use: Yes    Comment: occ wine with dinner   Drug use: No    Family History  Problem Relation Age of Onset   Alzheimer's disease Mother  CAD Mother        CABG x4   Cirrhosis Father        Died at age 22   CAD Maternal Grandmother        History of CABG   Stroke Maternal Grandfather        4 total   Heart disease Maternal Grandfather        Unsure of details   Healthy Brother    Colon cancer Neg Hx    Esophageal cancer Neg Hx    Rectal cancer Neg Hx    Stomach cancer Neg Hx     Allergies  Allergen Reactions   Iodinated Contrast Media Anaphylaxis   Iodine Anaphylaxis   Other Anaphylaxis   Shellfish Allergy Rash and Anaphylaxis    SHRIMP-Rash] [Shock] [Resp]   Shellfish-Derived Products Rash    Other reaction(s): Respiratory Distress (ALLERGY/intolerance), Shock (ALLERGY) SHRIMP-Rash] [Shock] [Resp]   Diflunisal Other (See Comments) and Swelling    Other reaction(s): Facial Edema (intolerance)   Penicillins Other (See Comments)    Other] headache   Sulfamethoxazole Rash   Voltaren  [Diclofenac Sodium] Swelling    Unilateral leg swelling Unilateral leg swelling   Diclofenac    Gabapentin Swelling   Lactose Intolerance (Gi)     Objective:   There were no vitals filed for this visit.  There is no height or weight on file to calculate BMI.  Physical Exam Vitals reviewed.  Constitutional:      Comments: Very pleasant woman seated in wheelchair.   HENT:     Mouth/Throat:     Mouth: No oral lesions.     Dentition: No dental abscesses.  Eyes:     General: No scleral icterus.    Pupils: Pupils are equal, round, and reactive to light.  Cardiovascular:     Rate and Rhythm:  Normal rate and regular rhythm.     Heart sounds: Normal heart sounds. No murmur heard. Pulmonary:     Effort: Pulmonary effort is normal. No respiratory distress.     Breath sounds: Normal breath sounds.  Abdominal:     General: Bowel sounds are normal. There is no distension.     Palpations: Abdomen is soft.     Tenderness: There is no abdominal tenderness.  Musculoskeletal:        General: No tenderness. Normal range of motion.     Cervical back: Neck supple.  Lymphadenopathy:     Cervical: No cervical adenopathy.  Skin:    General: Skin is warm and dry.     Findings: No rash.  Neurological:     Mental Status: She is alert and oriented to person, place, and time.  Psychiatric:        Mood and Affect: Mood and affect normal.        Judgment: Judgment normal.     Lab Results See external results in media tab  Assessment & Plan:   Problem List Items Addressed This Visit       Unprioritized   Human immunodeficiency virus (HIV) disease ([place]) - Primary (Chronic)    Very well controlled on once daily Tivicay + Odefsey . No concerns with access or adherence to medication. They are tolerating the medication well without side effects. No drug interactions identified. Pertinent lab tests will be reviewed when I come back.  No changes to insurance coverage.  No dental needs today.  No concern over anxious/depressed mood.  Sexual health and family planning discussed - cervical pap smear  screening due - will get this done at [facility] updated today - see health maintenance section.   Return in about 1 year (around 12/25/2023).        Relevant Medications   emtricitabine-rilpivir-tenofovir AF (ODEFSEY) 200-25-25 MG TABS tablet   dolutegravir (TIVICAY) 50 MG tablet    Janene Madeira, MSN, NP-C [place] [facility]   12/24/22

## 2022-12-24 NOTE — Assessment & Plan Note (Signed)
Very well controlled on once daily Tivicay + Odefsey . No concerns with access or adherence to medication. They are tolerating the medication well without side effects. No drug interactions identified. Pertinent lab tests will be reviewed when I come back.  No changes to insurance coverage.  No dental needs today.  No concern over anxious/depressed mood.  Sexual health and family planning discussed - cervical pap smear screening due - will get [facility] done at [facility]  Vaccines updated today - see health maintenance section.   Return in about 1 year (around 12/25/2023).

## 2023-02-04 ENCOUNTER — Encounter: Payer: Self-pay | Admitting: Internal Medicine

## 2023-02-18 ENCOUNTER — Ambulatory Visit: Payer: Medicare (Managed Care)

## 2023-03-11 ENCOUNTER — Ambulatory Visit
Admission: RE | Admit: 2023-03-11 | Discharge: 2023-03-11 | Disposition: A | Discharge status: Home / Self Care | Payer: Medicare (Managed Care) | Source: Ambulatory Visit | Attending: Internal Medicine | Admitting: Internal Medicine

## 2023-03-11 DIAGNOSIS — Z1231 Encounter for screening mammogram for malignant neoplasm of breast: Secondary | ICD-10-CM

## 2023-03-13 ENCOUNTER — Telehealth: Payer: Self-pay

## 2023-03-13 ENCOUNTER — Other Ambulatory Visit: Payer: Self-pay | Admitting: Nurse Practitioner

## 2023-03-13 DIAGNOSIS — R928 Other abnormal and inconclusive findings on diagnostic imaging of breast: Secondary | ICD-10-CM

## 2023-03-13 NOTE — Telephone Encounter (Signed)
Patient called requesting to go over lab results from a few months ago. Relayed that CD4 count was healthy at 459 and viral load was 50, technically undetectable. Lab results accessed via scanned document and [facility].   Patient verbalized understanding and has no further questions.   Beryle Flock, RN

## 2023-03-25 ENCOUNTER — Ambulatory Visit: Payer: Medicare (Managed Care)

## 2023-03-25 ENCOUNTER — Ambulatory Visit
Admission: RE | Admit: 2023-03-25 | Discharge: 2023-03-25 | Disposition: A | Discharge status: Home / Self Care | Payer: Medicare (Managed Care) | Source: Ambulatory Visit | Attending: Nurse Practitioner

## 2023-03-25 DIAGNOSIS — R928 Other abnormal and inconclusive findings on diagnostic imaging of breast: Secondary | ICD-10-CM

## 2023-04-10 LAB — HM HIV SCREENING LAB

## 2023-05-11 ENCOUNTER — Telehealth: Payer: Self-pay | Admitting: Student

## 2023-05-11 NOTE — Telephone Encounter (Signed)
Received phone call as physician on call that patient is deceased. She is not assigned to me, forwarding to affiliated provider to help with coordination of care.

## 2023-05-12 NOTE — Telephone Encounter (Signed)
Called [facility] to get additional information. Left voicemail for Ranae Plumber requesting call back.   [phone]  Sandie Ano, RN

## 2023-05-13 NOTE — Telephone Encounter (Signed)
Called [facility], left message for Katherine Howell's social worker, Katherine Howell, requesting call back.   Katherine Ano, RN

## 2023-05-13 NOTE — Telephone Encounter (Signed)
Spoke with Glendon Axe, Ms. Henzler did pass away over the weekend on either 6/1 or 6/2. [facility] has not yet received the death certificate to sign.   Sandie Ano, RN

## 2023-06-09 DEATH — deceased

## 2023-10-09 ENCOUNTER — Telehealth: Payer: Self-pay

## 2023-10-09 NOTE — Telephone Encounter (Signed)
Called patient on both numbers listed in chart to schedule follow up appointment, both numbers are not in service.   Sandie Ano, RN

## 2023-10-30 IMAGING — MG MM DIGITAL SCREENING BILAT W/ TOMO AND CAD
8 series · 8 of 24 positions shown · non-contrast
Comparison: Previous exam(s).

CLINICAL DATA: Screening.

EXAM:
DIGITAL SCREENING BILATERAL MAMMOGRAM WITH TOMOSYNTHESIS AND CAD
TECHNIQUE: Bilateral screening digital craniocaudal and mediolateral oblique
mammograms were obtained. Bilateral screening digital breast
tomosynthesis was performed. The images were evaluated with
computer-aided detection.

[R MLO synth-2D]
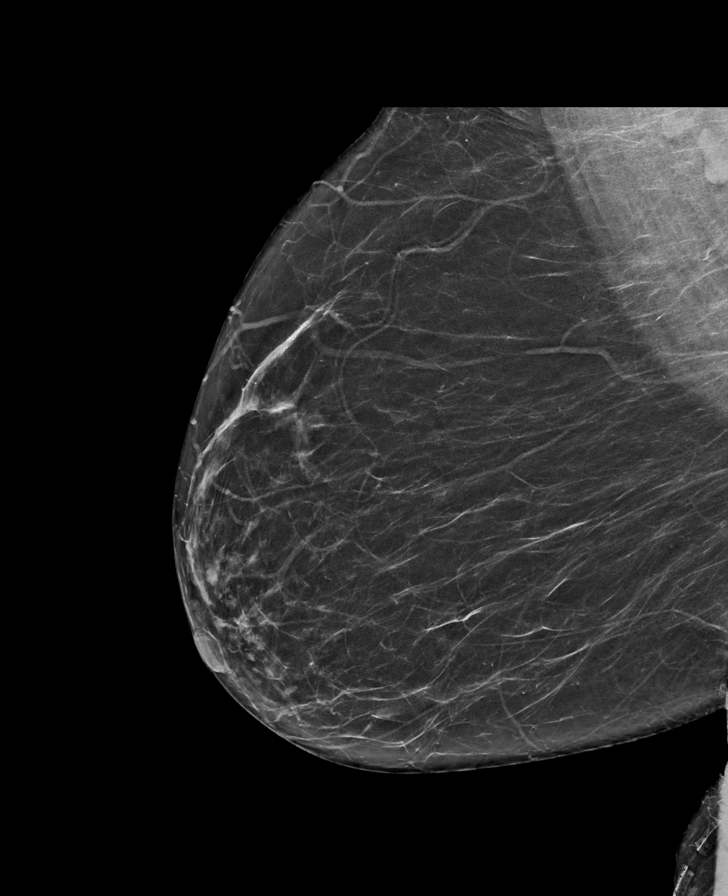

[L CC synth-2D]
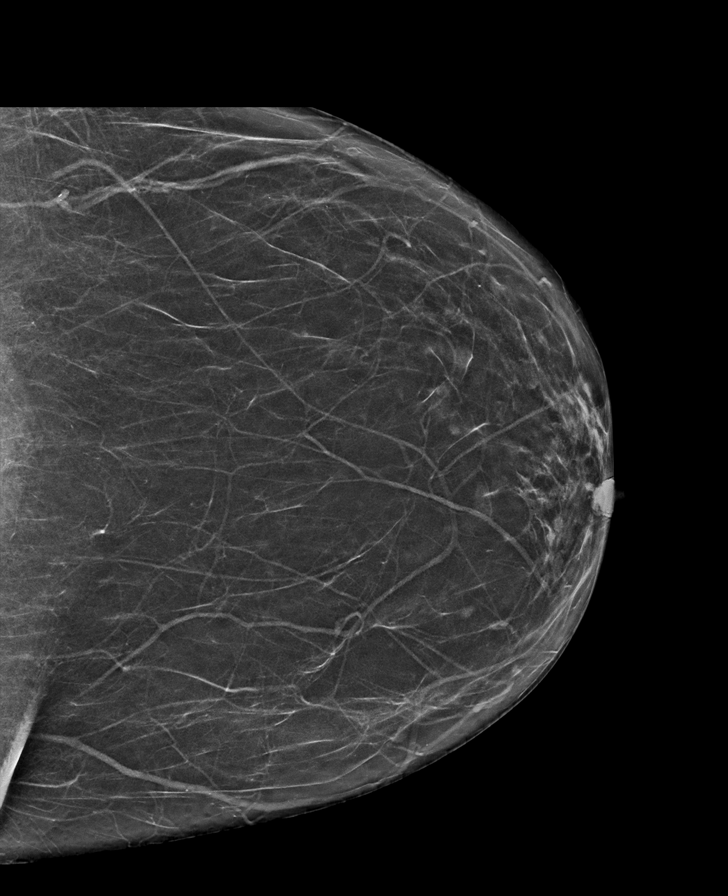

[L MLO synth-2D]
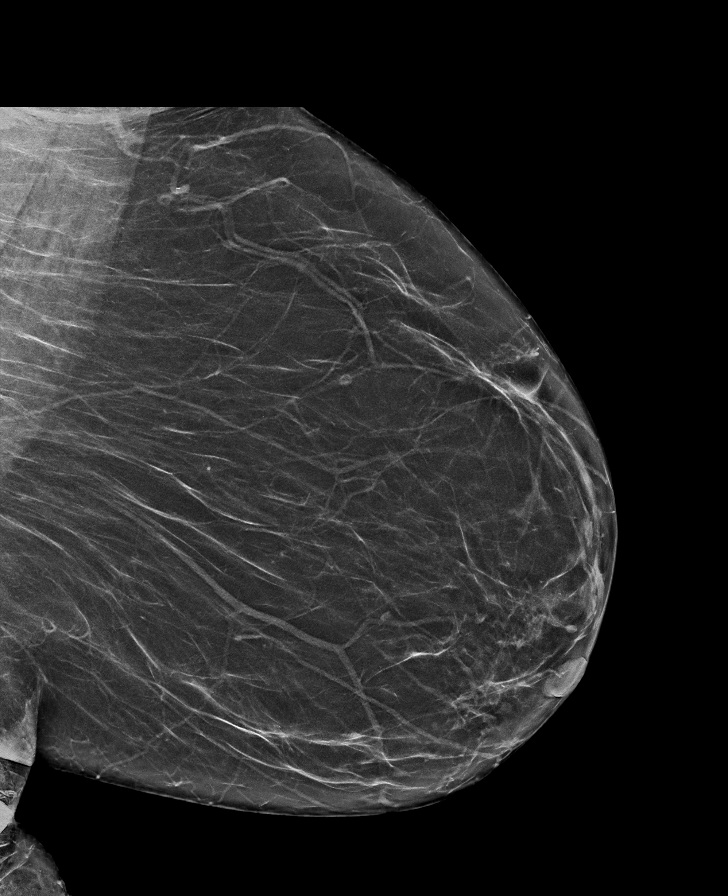

[R CC synth-2D]
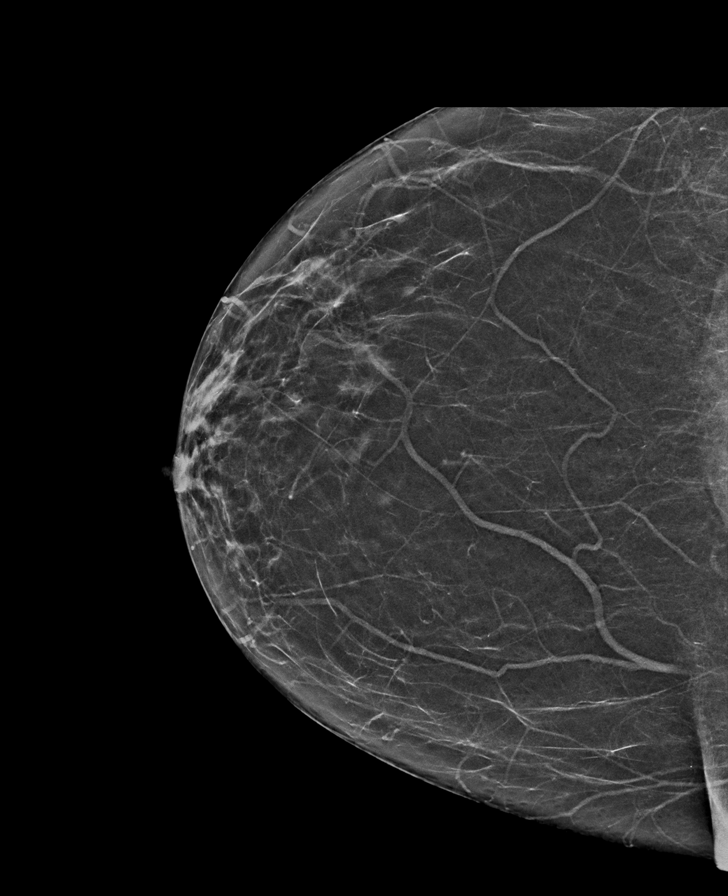

[R MLO tomo · tomo slice 33/66.0]
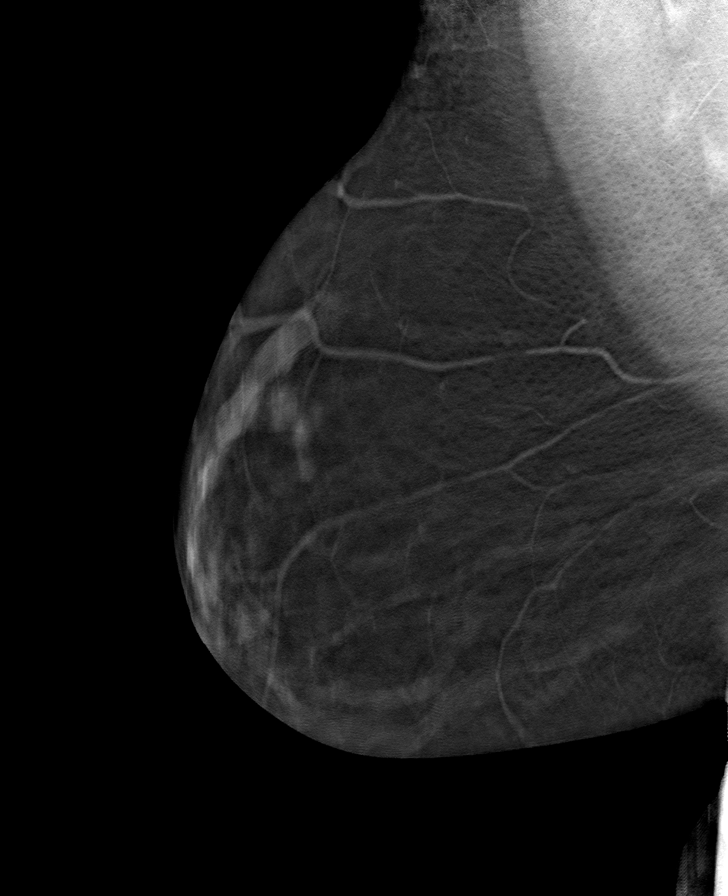

[L CC tomo · tomo slice 32/63.0]
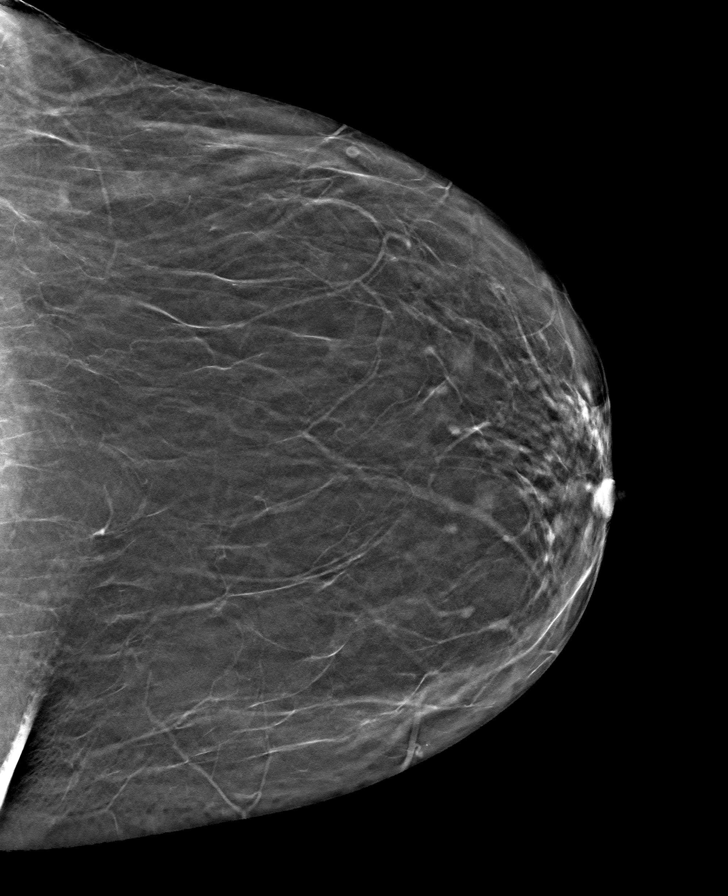

[R CC tomo · tomo slice 31/60.0]
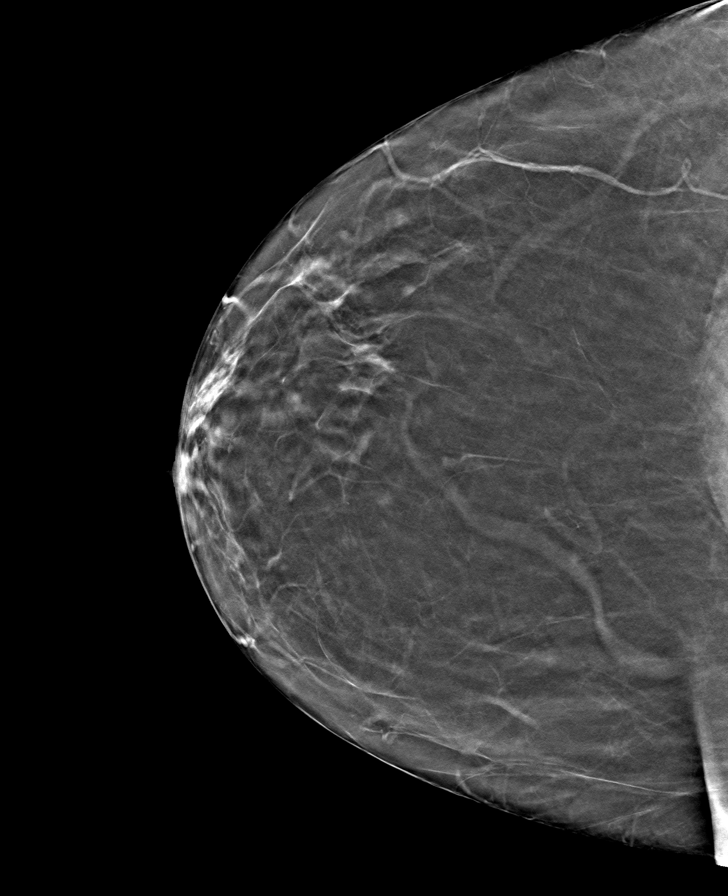

[L MLO tomo · tomo slice 34/67.0]
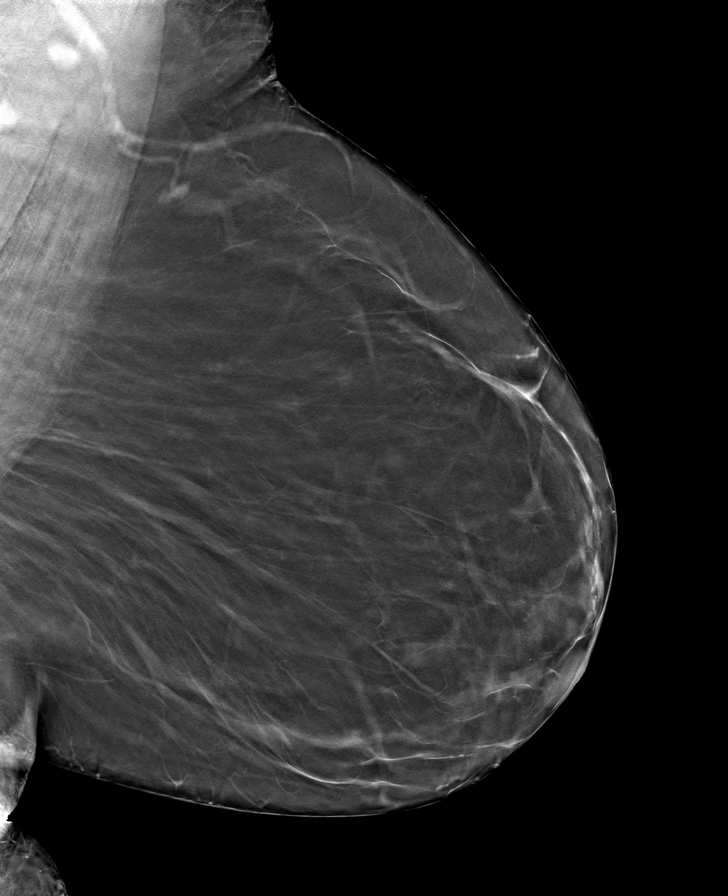

[8 of 24 positions shown; findings below may reference images not displayed]

ACR Breast Density Category b: There are scattered areas of
fibroglandular density.
FINDINGS: There are no findings suspicious for malignancy.
IMPRESSION: No mammographic evidence of malignancy. A result letter of this
screening mammogram will be mailed directly to the patient.

RECOMMENDATION:
Screening mammogram in one year. (Code:51-O-LD2)

BI-RADS CATEGORY  1: Negative.
